# Patient Record
Sex: Female | Born: 1935 | Race: White | Hispanic: No | Marital: Married | Smoking: Never smoker
Health system: Southern US, Community
[De-identification: ages and names within clinical notes are randomized; demographics above are authoritative.]

## PROBLEM LIST (undated history)

## (undated) DIAGNOSIS — K5790 Diverticulosis of intestine, part unspecified, without perforation or abscess without bleeding: Secondary | ICD-10-CM

## (undated) DIAGNOSIS — E039 Hypothyroidism, unspecified: Secondary | ICD-10-CM

## (undated) DIAGNOSIS — M1990 Unspecified osteoarthritis, unspecified site: Secondary | ICD-10-CM

## (undated) DIAGNOSIS — K5732 Diverticulitis of large intestine without perforation or abscess without bleeding: Secondary | ICD-10-CM

## (undated) DIAGNOSIS — K529 Noninfective gastroenteritis and colitis, unspecified: Secondary | ICD-10-CM

## (undated) DIAGNOSIS — I10 Essential (primary) hypertension: Secondary | ICD-10-CM

## (undated) DIAGNOSIS — N3020 Other chronic cystitis without hematuria: Secondary | ICD-10-CM

## (undated) DIAGNOSIS — R0789 Other chest pain: Secondary | ICD-10-CM

## (undated) DIAGNOSIS — K589 Irritable bowel syndrome without diarrhea: Secondary | ICD-10-CM

## (undated) DIAGNOSIS — Z8669 Personal history of other diseases of the nervous system and sense organs: Secondary | ICD-10-CM

## (undated) DIAGNOSIS — R011 Cardiac murmur, unspecified: Secondary | ICD-10-CM

## (undated) HISTORY — PX: COLON SURGERY: SHX602

## (undated) HISTORY — PX: CARDIAC CATHETERIZATION: SHX172

## (undated) HISTORY — PX: ABDOMINAL HYSTERECTOMY: SHX81

## (undated) HISTORY — PX: EYE SURGERY: SHX253

---

## 1988-01-03 HISTORY — PX: BLADDER SUSPENSION: SHX72

## 2003-12-23 ENCOUNTER — Ambulatory Visit: Payer: Self-pay | Admitting: Unknown Physician Specialty

## 2003-12-31 ENCOUNTER — Ambulatory Visit: Payer: Self-pay | Admitting: Unknown Physician Specialty

## 2004-05-24 ENCOUNTER — Ambulatory Visit: Payer: Self-pay | Admitting: Urology

## 2005-02-28 ENCOUNTER — Ambulatory Visit: Payer: Self-pay | Admitting: Unknown Physician Specialty

## 2005-05-16 ENCOUNTER — Ambulatory Visit: Payer: Self-pay | Admitting: Unknown Physician Specialty

## 2005-07-19 ENCOUNTER — Ambulatory Visit: Payer: Self-pay | Admitting: Urology

## 2006-03-02 ENCOUNTER — Ambulatory Visit: Payer: Self-pay | Admitting: Unknown Physician Specialty

## 2007-04-02 ENCOUNTER — Ambulatory Visit: Payer: Self-pay | Admitting: Unknown Physician Specialty

## 2008-04-01 ENCOUNTER — Ambulatory Visit: Payer: Self-pay | Admitting: Unknown Physician Specialty

## 2008-05-07 ENCOUNTER — Ambulatory Visit: Payer: Self-pay | Admitting: Unknown Physician Specialty

## 2009-06-03 ENCOUNTER — Ambulatory Visit: Payer: Self-pay | Admitting: Unknown Physician Specialty

## 2009-07-14 ENCOUNTER — Ambulatory Visit: Payer: Self-pay | Admitting: Unknown Physician Specialty

## 2009-10-01 ENCOUNTER — Other Ambulatory Visit: Payer: Self-pay | Admitting: Unknown Physician Specialty

## 2009-10-13 ENCOUNTER — Ambulatory Visit: Payer: Self-pay | Admitting: Unknown Physician Specialty

## 2010-03-03 ENCOUNTER — Ambulatory Visit: Payer: Self-pay | Admitting: Unknown Physician Specialty

## 2010-07-15 ENCOUNTER — Inpatient Hospital Stay: Payer: Self-pay | Admitting: Internal Medicine

## 2010-08-16 ENCOUNTER — Ambulatory Visit: Payer: Self-pay | Admitting: Unknown Physician Specialty

## 2010-11-30 ENCOUNTER — Ambulatory Visit: Payer: Self-pay | Admitting: Surgery

## 2011-08-17 ENCOUNTER — Ambulatory Visit: Payer: Self-pay | Admitting: Physician Assistant

## 2012-02-09 ENCOUNTER — Ambulatory Visit: Payer: Self-pay | Admitting: Surgery

## 2012-02-20 ENCOUNTER — Ambulatory Visit: Payer: Self-pay | Admitting: Surgery

## 2012-02-20 LAB — CBC WITH DIFFERENTIAL/PLATELET
Basophil #: 0.1 10*3/uL (ref 0.0–0.1)
Eosinophil %: 2.1 %
HCT: 41.4 % (ref 35.0–47.0)
HGB: 13.6 g/dL (ref 12.0–16.0)
Lymphocyte #: 1.7 10*3/uL (ref 1.0–3.6)
Lymphocyte %: 29.2 %
MCH: 30.4 pg (ref 26.0–34.0)
MCV: 93 fL (ref 80–100)
Monocyte #: 0.4 x10 3/mm (ref 0.2–0.9)
Neutrophil %: 60.2 %
RBC: 4.46 10*6/uL (ref 3.80–5.20)
WBC: 5.8 10*3/uL (ref 3.6–11.0)

## 2012-02-20 LAB — COMPREHENSIVE METABOLIC PANEL
Albumin: 3.7 g/dL (ref 3.4–5.0)
Bilirubin,Total: 0.5 mg/dL (ref 0.2–1.0)
Chloride: 105 mmol/L (ref 98–107)
Osmolality: 277 (ref 275–301)
Potassium: 3.9 mmol/L (ref 3.5–5.1)
SGOT(AST): 24 U/L (ref 15–37)
SGPT (ALT): 19 U/L (ref 12–78)
Total Protein: 7.8 g/dL (ref 6.4–8.2)

## 2012-02-26 ENCOUNTER — Inpatient Hospital Stay: Payer: Self-pay | Admitting: Surgery

## 2012-02-28 LAB — PLATELET COUNT: Platelet: 249 10*3/uL (ref 150–440)

## 2012-02-28 LAB — PATHOLOGY REPORT

## 2012-10-24 ENCOUNTER — Ambulatory Visit: Payer: Self-pay | Admitting: Physician Assistant

## 2013-06-18 ENCOUNTER — Ambulatory Visit: Payer: Self-pay | Admitting: Physician Assistant

## 2013-11-06 ENCOUNTER — Ambulatory Visit: Payer: Self-pay | Admitting: Unknown Physician Specialty

## 2013-11-12 ENCOUNTER — Ambulatory Visit: Payer: Self-pay | Admitting: Physician Assistant

## 2013-11-19 ENCOUNTER — Ambulatory Visit: Payer: Self-pay | Admitting: Unknown Physician Specialty

## 2014-04-24 NOTE — Op Note (Signed)
PATIENT NAME:  Chelsea Simpson, Chelsea Simpson MR#:  [id number] DATE OF BIRTH:  May 01, 1932  DATE OF PROCEDURE:  02/26/2012  PREOPERATIVE DIAGNOSIS: Chronic intermittent diverticulitis.   POSTOPERATIVE DIAGNOSIS: Chronic intermittent diverticulitis.   PROCEDURE: Laparoscopic low anterior resection of the sigmoid colon.   SURGEON: Rochel Brome, MD  ANESTHESIA: General.   INDICATIONS: This 80 year old female has had multiple bouts of diverticulitis with left lower quadrant abdominal pain and tenderness and treated with antibiotics. She had colonoscopy findings of diverticulosis, barium enema findings of diverticulosis, and CT findings of diverticulitis and surgery was recommended for definitive treatment to help avoid future cases of diverticulitis.  DESCRIPTION OF PROCEDURE: The patient was placed on the operating table, in the supine position, under general endotracheal anesthesia. The circulating nurse inserted a Foley urinary catheter with Betadine preparation of the perineum draining a clear yellow urine. The legs were placed into the lithotomy position using bumblebee stirrups. The abdomen was prepared with ChloraPrep and the perineum was prepared with Betadine solution. The abdomen was draped in a sterile manner.   A short incision was made just below the umbilicus and carried down to the deep fascia which was grasped with laryngeal hook and elevated. A Veress needle was inserted, aspirated and irrigated with a saline solution. Next, the peritoneal cavity was inflated with carbon dioxide. The Veress needle was removed. The 10 mm cannula was inserted. The 30 degree, 10 mm laparoscope was inserted to view the peritoneal cavity. There were a number of adhesions in the left lower quadrant between the omentum and the pelvic sidewall. The patient was placed in Trendelenburg position. Another incision was made, in the right lower quadrant, to introduce a 12 mm cannula. Another incision was made in the left lower  quadrant to introduce an 11 mm cannula. Another incision was made in the suprapubic area to insert an 11 mm cannula. Next, with the patient in Trendelenburg position, the small bowel was maneuvered up out of the pelvis and carried out towards the liver. A number of adhesions between the small bowel and the right pelvic sidewall were taken down with the Harmonic scalpel allowing further mobilization of the small bowel. Next, a number of adhesions between the omentum and the sigmoid colon were taken down from the left pelvic sidewall. The sigmoid colon was examined and noted multiple diverticula in the mid to distal portion of the sigmoid colon. The site for the distal margin of resection was identified and also selected a site for the proximal margin of resection which was approximately the junction of the upper third and middle third of the sigmoid colon. The window was created in the mesentery at the intended upper margin of resection and the Echelon Endo GIA stapler was placed across the bowel using blue load, engaged, activated and separated the bowel. Next, portions of the mesentery were dissected with the Harmonic scalpel and also portions of the mesentery were divided with white load of the Endo GIA stapler. Further dissection was carried out to create a window between the junction of the distal sigmoid colon and the proximal rectum and the blue load EEA stapler was placed across the bowel and activated. Next, the remainder of the mesenteric dissection was carried out with the Harmonic scalpel. The resected portion of bowel was held with a Babcock clamp and the proximal margin was held with another Babcock clamp. The air was allowed to escape from the peritoneal cavity as the laparoscope was removed. Next, a small incision was made in the suprapubic  area by removing the suprapubic trocar and made an incision which was approximately 4 cm in length and carried down through subcutaneous tissues. The midline  fascia was incised and the peritoneal cavity was opened. Next, the specimen was delivered up with a Babcock clamp. The distal end was tagged with a stitch. The specimen was some 10 inches in length. Multiple diverticula were noted and also there was 1 site which appeared that likely had been involved with diverticulitis where there was some localized swelling and induration, and this was submitted in formalin for routine pathology. Next, the proximal end was brought up through the incision. The staple line was excised as the edges were held with Allis clamps. The caliber of the bowel appeared to be somewhat small and attempted to insert the small sizer but would not fit. Bowel was dilated partially with Kelly clamp. Also had the anesthetist give 0.5 mg of atropine and subsequently the 3-0 Prolene pursestring suture was placed. The 25 mm EEA stapler was selected and the anvil was disconnected and placed into the proximal portion of bowel and the pursestring was tied down. A small amount of dissection was carried out to mobilize some fat off the bowel and there was 1 weak area and the pursestring was reinforced with another 3-0 Prolene. Next, the anvil with the distal portion of bowel was returned to the abdominal cavity. The peritoneum was closed with a running 3-0 chromic. The fascia was closed with interrupted 0 Maxon figure-of-eight sutures. Next, the laparoscope was reinserted as the abdomen was insufflated with carbon dioxide. The anvil was identified and I could see that there was no twisting of the bowel. Next, the small sizer was introduced through the rectum and went up to just a short distance from the staple line, but met some resistance due to small caliber, and also used to next size sizer. The EEA was inserted and was brought out along the anterior wall of the junction of the sigmoid colon and rectum as the pin was brought out and was attached to the anvil and the EEA was engaged to the firing range and  then was activated and subsequently removed. The anastomotic rings were intact and anastomosis looked good. A small amount of blood was aspirated during the course of the procedure and at this time it was irrigated with saline solution and aspirated. Hemostasis appeared to be intact. There was no tension on the anastomosis. Next, the laparoscopic ports were removed as carbon dioxide was allowed to escape from the peritoneal cavity. The skin incisions were closed with interrupted 5-0 Monocryl subcuticular suture, benzoin and Steri-Strips. Dressings were applied with paper tape. The patient tolerated surgery satisfactorily. Foley catheter was left intact. The patient was prepared for transfer to the recovery room.  ____________________________ Lenna Sciara. Rochel Brome, MD jws:sb D: 02/26/2012 11:23:59 ET T: 02/26/2012 12:07:39 ET JOB#: [id number]  cc: Loreli Dollar, MD, <Dictator> Loreli Dollar MD ELECTRONICALLY SIGNED 02/29/2012 17:59

## 2014-04-27 LAB — SURGICAL PATHOLOGY

## 2014-08-26 ENCOUNTER — Encounter: Payer: Self-pay | Admitting: *Deleted

## 2014-08-26 DIAGNOSIS — R011 Cardiac murmur, unspecified: Secondary | ICD-10-CM | POA: Diagnosis not present

## 2014-08-26 DIAGNOSIS — I10 Essential (primary) hypertension: Secondary | ICD-10-CM | POA: Diagnosis not present

## 2014-08-26 DIAGNOSIS — H2511 Age-related nuclear cataract, right eye: Secondary | ICD-10-CM | POA: Diagnosis present

## 2014-08-26 DIAGNOSIS — E039 Hypothyroidism, unspecified: Secondary | ICD-10-CM | POA: Diagnosis not present

## 2014-09-01 ENCOUNTER — Ambulatory Visit: Payer: Medicare Other | Admitting: Anesthesiology

## 2014-09-01 ENCOUNTER — Encounter
Admission: RE | Disposition: A | Discharge status: Home / Self Care | Payer: Self-pay | Source: Ambulatory Visit | Attending: Ophthalmology

## 2014-09-01 ENCOUNTER — Encounter: Payer: Self-pay | Admitting: *Deleted

## 2014-09-01 ENCOUNTER — Ambulatory Visit
Admission: RE | Admit: 2014-09-01 | Discharge: 2014-09-01 | Disposition: A | Discharge status: Home / Self Care | Payer: Medicare Other | Source: Ambulatory Visit | Attending: Ophthalmology | Admitting: Ophthalmology

## 2014-09-01 DIAGNOSIS — I10 Essential (primary) hypertension: Secondary | ICD-10-CM | POA: Insufficient documentation

## 2014-09-01 DIAGNOSIS — E039 Hypothyroidism, unspecified: Secondary | ICD-10-CM | POA: Insufficient documentation

## 2014-09-01 DIAGNOSIS — H2511 Age-related nuclear cataract, right eye: Secondary | ICD-10-CM | POA: Insufficient documentation

## 2014-09-01 DIAGNOSIS — R011 Cardiac murmur, unspecified: Secondary | ICD-10-CM | POA: Insufficient documentation

## 2014-09-01 HISTORY — DX: Hypothyroidism, unspecified: E03.9

## 2014-09-01 HISTORY — DX: Cardiac murmur, unspecified: R01.1

## 2014-09-01 HISTORY — DX: Essential (primary) hypertension: I10

## 2014-09-01 HISTORY — PX: CATARACT EXTRACTION W/PHACO: SHX586

## 2014-09-01 SURGERY — PHACOEMULSIFICATION, CATARACT, WITH IOL INSERTION
Anesthesia: Monitor Anesthesia Care | Site: Eye | Laterality: Right | Wound class: Clean

## 2014-09-01 MED ORDER — POVIDONE-IODINE 5 % OP SOLN
1.0000 "application " | OPHTHALMIC | Status: AC | PRN
  Administered 2014-09-01: 1 via OPHTHALMIC

## 2014-09-01 MED ORDER — ARMC OPHTHALMIC DILATING GEL
1.0000 | OPHTHALMIC | Status: DC | PRN
Start: 2014-09-01 — End: 2014-09-01
  Administered 2014-09-01: 1 via OPHTHALMIC

## 2014-09-01 MED ORDER — MIDAZOLAM HCL 2 MG/2ML IJ SOLN
INTRAMUSCULAR | Status: DC | PRN
  Administered 2014-09-01: 1 mg via INTRAVENOUS

## 2014-09-01 MED ORDER — POVIDONE-IODINE 5 % OP SOLN
OPHTHALMIC | Status: AC
  Administered 2014-09-01: 1 via OPHTHALMIC
  Filled 2014-09-01: qty 30

## 2014-09-01 MED ORDER — TETRACAINE HCL 0.5 % OP SOLN
OPHTHALMIC | Status: AC
  Administered 2014-09-01: 1 [drp] via OPHTHALMIC
  Filled 2014-09-01: qty 2

## 2014-09-01 MED ORDER — MOXIFLOXACIN HCL 0.5 % OP SOLN
OPHTHALMIC | Status: DC
Start: 2014-09-01 — End: 2014-09-01
  Filled 2014-09-01: qty 3

## 2014-09-01 MED ORDER — NA CHONDROIT SULF-NA HYALURON 40-17 MG/ML IO SOLN
INTRAOCULAR | Status: DC | PRN
  Administered 2014-09-01: 1 mL via INTRAOCULAR

## 2014-09-01 MED ORDER — NA CHONDROIT SULF-NA HYALURON 40-17 MG/ML IO SOLN
INTRAOCULAR | Status: AC
  Filled 2014-09-01: qty 1

## 2014-09-01 MED ORDER — TETRACAINE HCL 0.5 % OP SOLN
1.0000 [drp] | OPHTHALMIC | Status: AC | PRN
Start: 2014-09-01 — End: 2014-09-01
  Administered 2014-09-01: 1 [drp] via OPHTHALMIC

## 2014-09-01 MED ORDER — MOXIFLOXACIN HCL 0.5 % OP SOLN: 1.0000 [drp] | OPHTHALMIC | Status: DC | PRN

## 2014-09-01 MED ORDER — EPINEPHRINE HCL 1 MG/ML IJ SOLN
INTRAOCULAR | Status: DC | PRN
  Administered 2014-09-01: 200 mL via OPHTHALMIC

## 2014-09-01 MED ORDER — ARMC OPHTHALMIC DILATING GEL
OPHTHALMIC | Status: AC
  Administered 2014-09-01: 1 via OPHTHALMIC
  Filled 2014-09-01: qty 0.25

## 2014-09-01 MED ORDER — SODIUM CHLORIDE 0.9 % IV SOLN
INTRAVENOUS | Status: DC
  Administered 2014-09-01: 08:00:00 via INTRAVENOUS

## 2014-09-01 MED ORDER — MOXIFLOXACIN HCL 0.5 % OP SOLN
OPHTHALMIC | Status: DC | PRN
  Administered 2014-09-01: 1 [drp] via OPHTHALMIC

## 2014-09-01 MED ORDER — CEFUROXIME OPHTHALMIC INJECTION 1 MG/0.1 ML
INJECTION | OPHTHALMIC | Status: AC
  Filled 2014-09-01: qty 0.1

## 2014-09-01 MED ORDER — FENTANYL CITRATE (PF) 100 MCG/2ML IJ SOLN
INTRAMUSCULAR | Status: DC | PRN
  Administered 2014-09-01: 50 ug via INTRAVENOUS

## 2014-09-01 MED ORDER — CEFUROXIME OPHTHALMIC INJECTION 1 MG/0.1 ML
INJECTION | OPHTHALMIC | Status: DC | PRN
  Administered 2014-09-01: 0.1 mL via INTRACAMERAL

## 2014-09-01 MED ORDER — EPINEPHRINE HCL 1 MG/ML IJ SOLN
INTRAMUSCULAR | Status: AC
  Filled 2014-09-01: qty 1

## 2014-09-01 SURGICAL SUPPLY — 22 items
CANNULA ANT/CHMB 27GA (MISCELLANEOUS) ×3 IMPLANT
CUP MEDICINE 2OZ PLAST GRAD ST (MISCELLANEOUS) ×3 IMPLANT
GLOVE BIO SURGEON STRL SZ8 (GLOVE) ×3 IMPLANT
GLOVE BIOGEL M 6.5 STRL (GLOVE) ×3 IMPLANT
GLOVE SURG LX 8.0 MICRO (GLOVE) ×2
GLOVE SURG LX STRL 8.0 MICRO (GLOVE) ×1 IMPLANT
GOWN STRL REUS W/ TWL LRG LVL3 (GOWN DISPOSABLE) ×2 IMPLANT
GOWN STRL REUS W/TWL LRG LVL3 (GOWN DISPOSABLE) ×6
LENS IOL TECNIS 25.0 (Intraocular Lens) ×3 IMPLANT
LENS IOL TECNIS MONO 1P 25.0 (Intraocular Lens) ×1 IMPLANT
PACK CATARACT (MISCELLANEOUS) ×3 IMPLANT
PACK CATARACT BRASINGTON LX (MISCELLANEOUS) ×3 IMPLANT
PACK EYE AFTER SURG (MISCELLANEOUS) ×3 IMPLANT
SOL BSS BAG (MISCELLANEOUS) ×3
SOL PREP PVP 2OZ (MISCELLANEOUS) ×3
SOLUTION BSS BAG (MISCELLANEOUS) ×1 IMPLANT
SOLUTION PREP PVP 2OZ (MISCELLANEOUS) ×1 IMPLANT
SYR 3ML LL SCALE MARK (SYRINGE) ×3 IMPLANT
SYR 5ML LL (SYRINGE) ×3 IMPLANT
SYR TB 1ML 27GX1/2 LL (SYRINGE) ×3 IMPLANT
WATER STERILE IRR 1000ML POUR (IV SOLUTION) ×3 IMPLANT
WIPE NON LINTING 3.25X3.25 (MISCELLANEOUS) ×3 IMPLANT

## 2014-09-01 NOTE — Op Note (Signed)
PREOPERATIVE DIAGNOSIS:  Nuclear sclerotic cataract of the right eye.   POSTOPERATIVE DIAGNOSIS: right nucelar sclerotic cataract   OPERATIVE PROCEDURE:  Procedure(s): CATARACT EXTRACTION PHACO AND INTRAOCULAR LENS PLACEMENT (IOC)   SURGEON:  Galen Manila, MD.   ANESTHESIA:  Anesthesiologist: Yves Dill, MD CRNA: Chong Sicilian, CRNA  1.      Managed anesthesia care. 2.      Topical tetracaine drops followed by 2% Xylocaine jelly applied in the preoperative holding area.   COMPLICATIONS:  None.   TECHNIQUE:   Stop and chop   DESCRIPTION OF PROCEDURE:  The patient was examined and consented in the preoperative holding area where the aforementioned topical anesthesia was applied to the right eye and then brought back to the Operating Room where the right eye was prepped and draped in the usual sterile ophthalmic fashion and a lid speculum was placed. A paracentesis was created with the side port blade and the anterior chamber was filled with viscoelastic. A near clear corneal incision was performed with the steel keratome. A continuous curvilinear capsulorrhexis was performed with a cystotome followed by the capsulorrhexis forceps. Hydrodissection and hydrodelineation were carried out with BSS on a blunt cannula. The lens was removed in a stop and chop  technique and the remaining cortical material was removed with the irrigation-aspiration handpiece. The capsular bag was inflated with viscoelastic and the Technis ZCB00  lens was placed in the capsular bag without complication. The remaining viscoelastic was removed from the eye with the irrigation-aspiration handpiece. The wounds were hydrated. The anterior chamber was flushed with Miostat and the eye was inflated to physiologic pressure. 0.1 mL of cefuroxime concentration 10 mg/mL was placed in the anterior chamber. The wounds were found to be water tight. The eye was dressed with Vigamox. The patient was given protective glasses to wear  throughout the day and a shield with which to sleep tonight. The patient was also given drops with which to begin a drop regimen today and will follow-up with me in one day.  Implant Name Type Inv. Item Serial No. Manufacturer Lot No. LRB No. Used  LENS IMPL INTRAOC ZCB00 25.0 - [id number] Intraocular Lens LENS IMPL INTRAOC ZCB00 25.0 [id number]   Right 1   Procedure(s) with comments: CATARACT EXTRACTION PHACO AND INTRAOCULAR LENS PLACEMENT (IOC) (Right) - US:2:12 AP%: 25.1 CDE:33.29 Lot # [id number]  Electronically signed: Ailey Wessling LOUIS 09/01/2014 8:51 AM

## 2014-09-01 NOTE — Transfer of Care (Signed)
Immediate Anesthesia Transfer of Care Note  Patient: Chelsea Simpson  Procedure(s) Performed: Procedure(s) with comments: CATARACT EXTRACTION PHACO AND INTRAOCULAR LENS PLACEMENT (IOC) (Right) - US:2:12 AP%: 25.1 CDE:33.29 Lot # [id number]  Patient Location: PACU and Short Stay  Anesthesia Type:MAC  Level of Consciousness: awake, alert  and oriented  Airway & Oxygen Therapy: Patient Spontanous Breathing and Patient connected to nasal cannula oxygen  Post-op Assessment: Report given to RN and Post -op Vital signs reviewed and stable  Post vital signs: Reviewed and stable  Last Vitals:  Filed Vitals:   09/01/14 0853  BP: 126/58  Pulse: 82  Temp: 36.4 C  Resp:     Complications: No apparent anesthesia complications

## 2014-09-01 NOTE — Discharge Instructions (Signed)
AMBULATORY SURGERY  DISCHARGE INSTRUCTIONS   1) The drugs that you were given will stay in your system until tomorrow so for the next 24 hours you should not:  A) Drive an automobile B) Make any legal decisions C) Drink any alcoholic beverage   2) You may resume regular meals tomorrow.  Today it is better to start with liquids and gradually work up to solid foods.  You may eat anything you prefer, but it is better to start with liquids, then soup and crackers, and gradually work up to solid foods.   3) Please notify your doctor immediately if you have any unusual bleeding, trouble breathing, redness and pain at the surgery site, drainage, fever, or pain not relieved by medication.    4) Additional Instructions:    Eye Surgery Discharge Instructions  Expect mild scratchy sensation or mild soreness. DO NOT RUB YOUR EYE!  The day of surgery:  Minimal physical activity, but bed rest is not required  No reading, computer work, or close hand work  No bending, lifting, or straining.  May watch TV  For 24 hours:  No driving, legal decisions, or alcoholic beverages  Safety precautions  Eat anything you prefer: It is better to start with liquids, then soup then solid foods.  _____ Eye patch should be worn until postoperative exam tomorrow.  ____ Solar shield eyeglasses should be worn for comfort in the sunlight/patch while sleeping  Resume all regular medications including aspirin or Coumadin if these were discontinued prior to surgery. You may shower, bathe, shave, or wash your hair. Tylenol may be taken for mild discomfort.  Call your doctor if you experience significant pain, nausea, or vomiting, fever > 101 or other signs of infection. [phone] or [phone] Specific instructions:  Follow-up Information    Follow up with PORFILIO,WILLIAM LOUIS, MD In 1 day.   Specialty:  Ophthalmology   Why:  August 31 at 8:50am   Contact information:   [address] [place] [address] [phone]         Please contact your physician with any problems or Same Day Surgery at [phone], Monday through Friday 6 am to 4 pm, or Kevin at [facility] number at [phone].

## 2014-09-01 NOTE — Anesthesia Postprocedure Evaluation (Signed)
  Anesthesia Post-op Note  Patient: Chelsea Simpson  Procedure(s) Performed: Procedure(s) with comments: CATARACT EXTRACTION PHACO AND INTRAOCULAR LENS PLACEMENT (IOC) (Right) - US:2:12 AP%: 25.1 CDE:33.29 Lot # [id number]  Anesthesia type:MAC  Patient location: short stay  Post pain: Pain level controlled  Post assessment: Post-op Vital signs reviewed, Patient's Cardiovascular Status Stable, Respiratory Function Stable, Patent Airway and No signs of Nausea or vomiting  Post vital signs: Reviewed and stable  Last Vitals:  Filed Vitals:   09/01/14 0853  BP: 126/58  Pulse: 82  Temp: 36.4 C  Resp:     Level of consciousness: awake, alert  and patient cooperative  Complications: No apparent anesthesia complications

## 2014-09-01 NOTE — H&P (Signed)
  All labs reviewed. Abnormal studies sent to patients PCP when indicated.  Previous H&P reviewed, patient examined, there are NO CHANGES.  Chelsea Simpson LOUIS8/30/20168:21 AM

## 2014-09-01 NOTE — Anesthesia Preprocedure Evaluation (Addendum)
Anesthesia Evaluation  Patient identified by MRN, date of birth, ID band Patient awake    Reviewed: Allergy & Precautions, NPO status , Patient's Chart, lab work & pertinent test results  Airway Mallampati: III  TM Distance: >3 FB Neck ROM: Full    Dental  (+) Caps   Pulmonary neg pulmonary ROS,    Pulmonary exam normal       Cardiovascular hypertension, Normal cardiovascular exam+ Valvular Problems/Murmurs     Neuro/Psych negative neurological ROS  negative psych ROS   GI/Hepatic negative GI ROS, Neg liver ROS,   Endo/Other  Hypothyroidism   Renal/GU negative Renal ROS  negative genitourinary   Musculoskeletal negative musculoskeletal ROS (+)   Abdominal Normal abdominal exam  (+)   Peds negative pediatric ROS (+)  Hematology negative hematology ROS (+)   Anesthesia Other Findings   Reproductive/Obstetrics                            Anesthesia Physical Anesthesia Plan  ASA: II  Anesthesia Plan: MAC   Post-op Pain Management:    Induction: Intravenous  Airway Management Planned: Nasal Cannula  Additional Equipment:   Intra-op Plan:   Post-operative Plan:   Informed Consent: I have reviewed the patients History and Physical, chart, labs and discussed the procedure including the risks, benefits and alternatives for the proposed anesthesia with the patient or authorized representative who has indicated his/her understanding and acceptance.   Dental advisory given  Plan Discussed with: CRNA and Surgeon  Anesthesia Plan Comments:         Anesthesia Quick Evaluation

## 2014-09-14 ENCOUNTER — Encounter: Payer: Self-pay | Admitting: *Deleted

## 2014-09-15 ENCOUNTER — Ambulatory Visit: Payer: Medicare Other | Admitting: Anesthesiology

## 2014-09-15 ENCOUNTER — Encounter
Admission: RE | Disposition: A | Discharge status: Home / Self Care | Payer: Self-pay | Source: Ambulatory Visit | Attending: Ophthalmology

## 2014-09-15 ENCOUNTER — Encounter: Payer: Self-pay | Admitting: *Deleted

## 2014-09-15 ENCOUNTER — Ambulatory Visit
Admission: RE | Admit: 2014-09-15 | Discharge: 2014-09-15 | Disposition: A | Discharge status: Home / Self Care | Payer: Medicare Other | Source: Ambulatory Visit | Attending: Ophthalmology | Admitting: Ophthalmology

## 2014-09-15 DIAGNOSIS — E039 Hypothyroidism, unspecified: Secondary | ICD-10-CM | POA: Diagnosis not present

## 2014-09-15 DIAGNOSIS — H2512 Age-related nuclear cataract, left eye: Secondary | ICD-10-CM | POA: Diagnosis present

## 2014-09-15 DIAGNOSIS — R011 Cardiac murmur, unspecified: Secondary | ICD-10-CM | POA: Insufficient documentation

## 2014-09-15 DIAGNOSIS — I10 Essential (primary) hypertension: Secondary | ICD-10-CM | POA: Insufficient documentation

## 2014-09-15 HISTORY — PX: CATARACT EXTRACTION W/PHACO: SHX586

## 2014-09-15 SURGERY — PHACOEMULSIFICATION, CATARACT, WITH IOL INSERTION
Anesthesia: Monitor Anesthesia Care | Site: Eye | Laterality: Left | Wound class: Clean

## 2014-09-15 MED ORDER — BSS IO SOLN
INTRAOCULAR | Status: DC | PRN
  Administered 2014-09-15: 200 mL via OPHTHALMIC

## 2014-09-15 MED ORDER — TETRACAINE HCL 0.5 % OP SOLN
OPHTHALMIC | Status: AC
  Administered 2014-09-15: 1 [drp] via OPHTHALMIC
  Filled 2014-09-15: qty 2

## 2014-09-15 MED ORDER — NA CHONDROIT SULF-NA HYALURON 40-17 MG/ML IO SOLN
INTRAOCULAR | Status: DC | PRN
  Administered 2014-09-15: 1 mL via INTRAOCULAR

## 2014-09-15 MED ORDER — CEFUROXIME OPHTHALMIC INJECTION 1 MG/0.1 ML
INJECTION | OPHTHALMIC | Status: DC | PRN
  Administered 2014-09-15: 0.1 mL via INTRACAMERAL

## 2014-09-15 MED ORDER — POVIDONE-IODINE 5 % OP SOLN
OPHTHALMIC | Status: AC
  Administered 2014-09-15: 1 via OPHTHALMIC
  Filled 2014-09-15: qty 30

## 2014-09-15 MED ORDER — ARMC OPHTHALMIC DILATING GEL
1.0000 "application " | OPHTHALMIC | Status: AC | PRN
  Administered 2014-09-15 (×2): 1 via OPHTHALMIC

## 2014-09-15 MED ORDER — EPINEPHRINE HCL 1 MG/ML IJ SOLN
INTRAMUSCULAR | Status: AC
  Filled 2014-09-15: qty 1

## 2014-09-15 MED ORDER — POVIDONE-IODINE 5 % OP SOLN
1.0000 "application " | OPHTHALMIC | Status: AC | PRN
  Administered 2014-09-15: 1 via OPHTHALMIC

## 2014-09-15 MED ORDER — DEXMEDETOMIDINE HCL 200 MCG/2ML IV SOLN
INTRAVENOUS | Status: DC | PRN
  Administered 2014-09-15: 6 ug via INTRAVENOUS

## 2014-09-15 MED ORDER — ARMC OPHTHALMIC DILATING GEL
OPHTHALMIC | Status: AC
  Administered 2014-09-15: 1 via OPHTHALMIC
  Filled 2014-09-15: qty 0.25

## 2014-09-15 MED ORDER — SODIUM CHLORIDE 0.9 % IV SOLN
INTRAVENOUS | Status: DC
  Administered 2014-09-15: 11:00:00 via INTRAVENOUS

## 2014-09-15 MED ORDER — CEFUROXIME OPHTHALMIC INJECTION 1 MG/0.1 ML
INJECTION | OPHTHALMIC | Status: AC
  Filled 2014-09-15: qty 0.1

## 2014-09-15 MED ORDER — MOXIFLOXACIN HCL 0.5 % OP SOLN: 1.0000 [drp] | OPHTHALMIC | Status: DC | PRN

## 2014-09-15 MED ORDER — NA CHONDROIT SULF-NA HYALURON 40-17 MG/ML IO SOLN
INTRAOCULAR | Status: AC
  Filled 2014-09-15: qty 1

## 2014-09-15 MED ORDER — MOXIFLOXACIN HCL 0.5 % OP SOLN
OPHTHALMIC | Status: DC | PRN
  Administered 2014-09-15: 1 [drp] via OPHTHALMIC

## 2014-09-15 MED ORDER — TETRACAINE HCL 0.5 % OP SOLN
1.0000 [drp] | OPHTHALMIC | Status: AC | PRN
  Administered 2014-09-15: 1 [drp] via OPHTHALMIC

## 2014-09-15 MED ORDER — MOXIFLOXACIN HCL 0.5 % OP SOLN
OPHTHALMIC | Status: AC
  Filled 2014-09-15: qty 3

## 2014-09-15 MED ORDER — FENTANYL CITRATE (PF) 100 MCG/2ML IJ SOLN
INTRAMUSCULAR | Status: DC | PRN
  Administered 2014-09-15: 50 ug via INTRAVENOUS

## 2014-09-15 SURGICAL SUPPLY — 22 items
CANNULA ANT/CHMB 27GA (MISCELLANEOUS) ×3 IMPLANT
CUP MEDICINE 2OZ PLAST GRAD ST (MISCELLANEOUS) ×3 IMPLANT
GLOVE BIO SURGEON STRL SZ8 (GLOVE) ×3 IMPLANT
GLOVE BIOGEL M 6.5 STRL (GLOVE) ×3 IMPLANT
GLOVE SURG LX 8.0 MICRO (GLOVE) ×2
GLOVE SURG LX STRL 8.0 MICRO (GLOVE) ×1 IMPLANT
GOWN STRL REUS W/ TWL LRG LVL3 (GOWN DISPOSABLE) ×2 IMPLANT
GOWN STRL REUS W/TWL LRG LVL3 (GOWN DISPOSABLE) ×4
LENS IOL TECNIS 24.5 (Intraocular Lens) ×3 IMPLANT
LENS IOL TECNIS MONO 1P 24.5 (Intraocular Lens) ×1 IMPLANT
PACK CATARACT (MISCELLANEOUS) ×3 IMPLANT
PACK CATARACT BRASINGTON LX (MISCELLANEOUS) ×3 IMPLANT
PACK EYE AFTER SURG (MISCELLANEOUS) ×3 IMPLANT
SOL BSS BAG (MISCELLANEOUS) ×3
SOL PREP PVP 2OZ (MISCELLANEOUS) ×3
SOLUTION BSS BAG (MISCELLANEOUS) ×1 IMPLANT
SOLUTION PREP PVP 2OZ (MISCELLANEOUS) ×1 IMPLANT
SYR 3ML LL SCALE MARK (SYRINGE) ×3 IMPLANT
SYR 5ML LL (SYRINGE) ×3 IMPLANT
SYR TB 1ML 27GX1/2 LL (SYRINGE) ×3 IMPLANT
WATER STERILE IRR 1000ML POUR (IV SOLUTION) ×3 IMPLANT
WIPE NON LINTING 3.25X3.25 (MISCELLANEOUS) ×3 IMPLANT

## 2014-09-15 NOTE — Discharge Instructions (Signed)
FOLLOW DR PORFILIO'S POSTOP EYE DROP DISCHARGE INSTRUCTION SHEET AS REVIEWED  Eye Surgery Discharge Instructions  Expect mild scratchy sensation or mild soreness. DO NOT RUB YOUR EYE!  The day of surgery:  Minimal physical activity, but bed rest is not required  No reading, computer work, or close hand work  No bending, lifting, or straining.  May watch TV  For 24 hours:  No driving, legal decisions, or alcoholic beverages  Safety precautions  Eat anything you prefer: It is better to start with liquids, then soup then solid foods.  _____ Eye patch should be worn until postoperative exam tomorrow.  ____ Solar shield eyeglasses should be worn for comfort in the sunlight/patch while sleeping  Resume all regular medications including aspirin or Coumadin if these were discontinued prior to surgery. You may shower, bathe, shave, or wash your hair. Tylenol may be taken for mild discomfort.  Call your doctor if you experience significant pain, nausea, or vomiting, fever > 101 or other signs of infection. [phone] or [phone] Specific instructions:  Follow-up Information    Follow up with Carlena Bjornstad, MD.   Specialty:  Ophthalmology   Why:  09/16/14 @ 9:10 am   Contact information:   [address] [place] [address] [phone]

## 2014-09-15 NOTE — H&P (Signed)
  All labs reviewed. Abnormal studies sent to patients PCP when indicated.  Previous H&P reviewed, patient examined, there are NO CHANGES.  Chelsea Simpson LOUIS9/13/201612:25 PM

## 2014-09-15 NOTE — Anesthesia Preprocedure Evaluation (Addendum)
Anesthesia Evaluation  Patient identified by MRN, date of birth, ID band Patient awake    Reviewed: Allergy & Precautions, NPO status , Patient's Chart, lab work & pertinent test results  History of Anesthesia Complications Negative for: history of anesthetic complications  Airway Mallampati: III  TM Distance: >3 FB Neck ROM: Full    Dental  (+) Caps   Pulmonary neg pulmonary ROS,    Pulmonary exam normal        Cardiovascular Exercise Tolerance: Good hypertension, On Medications Normal cardiovascular exam+ Valvular Problems/Murmurs      Neuro/Psych negative neurological ROS  negative psych ROS   GI/Hepatic negative GI ROS, Neg liver ROS,   Endo/Other  Hypothyroidism   Renal/GU negative Renal ROS  negative genitourinary   Musculoskeletal negative musculoskeletal ROS (+)   Abdominal Normal abdominal exam  (+)   Peds negative pediatric ROS (+)  Hematology negative hematology ROS (+)   Anesthesia Other Findings Past Medical History:   Hypertension                                                 Heart murmur                                                 Hypothyroidism                                               Reproductive/Obstetrics negative OB ROS                            Anesthesia Physical  Anesthesia Plan  ASA: II  Anesthesia Plan: MAC   Post-op Pain Management:    Induction: Intravenous  Airway Management Planned: Nasal Cannula  Additional Equipment:   Intra-op Plan:   Post-operative Plan:   Informed Consent: I have reviewed the patients History and Physical, chart, labs and discussed the procedure including the risks, benefits and alternatives for the proposed anesthesia with the patient or authorized representative who has indicated his/her understanding and acceptance.   Dental advisory given  Plan Discussed with: CRNA and Surgeon  Anesthesia Plan  Comments:         Anesthesia Quick Evaluation

## 2014-09-15 NOTE — Transfer of Care (Signed)
Immediate Anesthesia Transfer of Care Note  Patient: Chelsea Simpson  Procedure(s) Performed: Procedure(s) with comments: CATARACT EXTRACTION PHACO AND INTRAOCULAR LENS PLACEMENT (IOC) (Left) - [place] 01:02.6 AP%: 18.4% CDE: 11.53 lOT #[id number]  Patient Location: PACU  Anesthesia Type:MAC  Level of Consciousness: awake, alert , oriented and patient cooperative  Airway & Oxygen Therapy: Patient Spontanous Breathing  Post-op Assessment: Report given to RN and Post -op Vital signs reviewed and stable  Post vital signs: Reviewed and stable  Last Vitals:  Filed Vitals:   09/15/14 1259  BP: 135/68  Pulse: 76  Temp: 36.6 C  Resp: 16    Complications: No apparent anesthesia complications

## 2014-09-15 NOTE — Op Note (Signed)
PREOPERATIVE DIAGNOSIS:  Nuclear sclerotic cataract of the left eye.   POSTOPERATIVE DIAGNOSIS:  left nuclear sclerotic cataract   OPERATIVE PROCEDURE:  Procedure(s): CATARACT EXTRACTION PHACO AND INTRAOCULAR LENS PLACEMENT (IOC)   SURGEON:  Galen Manila, MD.   ANESTHESIA:   Anesthesiologist: Lenard Simmer, MD CRNA: Darrol Jump, CRNA  1.      Managed anesthesia care. 2.      Topical tetracaine drops followed by 2% Xylocaine jelly applied in the preoperative holding area.   COMPLICATIONS:  None.   TECHNIQUE:   Stop and chop   DESCRIPTION OF PROCEDURE:  The patient was examined and consented in the preoperative holding area where the aforementioned topical anesthesia was applied to the left eye and then brought back to the Operating Room where the left eye was prepped and draped in the usual sterile ophthalmic fashion and a lid speculum was placed. A paracentesis was created with the side port blade and the anterior chamber was filled with viscoelastic. A near clear corneal incision was performed with the steel keratome. A continuous curvilinear capsulorrhexis was performed with a cystotome followed by the capsulorrhexis forceps. Hydrodissection and hydrodelineation were carried out with BSS on a blunt cannula. The lens was removed in a stop and chop  technique and the remaining cortical material was removed with the irrigation-aspiration handpiece. The capsular bag was inflated with viscoelastic and the Technis ZCB00 lens was placed in the capsular bag without complication. The remaining viscoelastic was removed from the eye with the irrigation-aspiration handpiece. The wounds were hydrated. The anterior chamber was flushed with Miostat and the eye was inflated to physiologic pressure. 0.1 mL of cefuroxime concentration 10 mg/mL was placed in the anterior chamber. The wounds were found to be water tight. The eye was dressed with Vigamox. The patient was given protective glasses to wear  throughout the day and a shield with which to sleep tonight. The patient was also given drops with which to begin a drop regimen today and will follow-up with me in one day.  Implant Name Type Inv. Item Serial No. Manufacturer Lot No. LRB No. Used  LENS IMPL INTRAOC ZCB00 24.5 - [id number] Intraocular Lens LENS IMPL INTRAOC ZCB00 24.5 [id number] AMO   Left 1   Procedure(s) with comments: CATARACT EXTRACTION PHACO AND INTRAOCULAR LENS PLACEMENT (IOC) (Left) - [place] 01:02.6 AP%: 18.4% CDE: 11.53 lOT #[id number]  Electronically signed: Eleno Weimar LOUIS 09/15/2014 12:57 PM

## 2014-09-15 NOTE — Anesthesia Postprocedure Evaluation (Signed)
  Anesthesia Post-op Note  Patient: Chelsea Simpson  Procedure(s) Performed: Procedure(s) with comments: CATARACT EXTRACTION PHACO AND INTRAOCULAR LENS PLACEMENT (IOC) (Left) - [place] 01:02.6 AP%: 18.4% CDE: 11.53 lOT #[id number]  Anesthesia type:MAC  Patient location: PACU  Post pain: Pain level controlled  Post assessment: Post-op Vital signs reviewed, Patient's Cardiovascular Status Stable, Respiratory Function Stable, Patent Airway and No signs of Nausea or vomiting  Post vital signs: Reviewed and stable  Last Vitals:  Filed Vitals:   09/15/14 1259  BP: 135/68  Pulse: 76  Temp: 36.6 C  Resp: 16    Level of consciousness: awake, alert  and patient cooperative  Complications: No apparent anesthesia complications

## 2014-12-07 ENCOUNTER — Other Ambulatory Visit: Payer: Self-pay | Admitting: Physician Assistant

## 2014-12-07 DIAGNOSIS — Z1231 Encounter for screening mammogram for malignant neoplasm of breast: Secondary | ICD-10-CM

## 2014-12-15 ENCOUNTER — Ambulatory Visit
Admission: RE | Admit: 2014-12-15 | Discharge: 2014-12-15 | Disposition: A | Discharge status: Home / Self Care | Payer: Medicare Other | Source: Ambulatory Visit | Attending: Physician Assistant | Admitting: Physician Assistant

## 2014-12-15 ENCOUNTER — Other Ambulatory Visit: Payer: Self-pay | Admitting: Physician Assistant

## 2014-12-15 DIAGNOSIS — Z1231 Encounter for screening mammogram for malignant neoplasm of breast: Secondary | ICD-10-CM

## 2015-11-05 ENCOUNTER — Other Ambulatory Visit: Payer: Self-pay | Admitting: Physician Assistant

## 2015-11-05 DIAGNOSIS — Z1231 Encounter for screening mammogram for malignant neoplasm of breast: Secondary | ICD-10-CM

## 2015-12-16 ENCOUNTER — Ambulatory Visit: Payer: Medicare Other

## 2015-12-17 ENCOUNTER — Ambulatory Visit
Admission: RE | Admit: 2015-12-17 | Discharge: 2015-12-17 | Disposition: A | Discharge status: Home / Self Care | Payer: Medicare Other | Source: Ambulatory Visit | Attending: Physician Assistant | Admitting: Physician Assistant

## 2015-12-17 DIAGNOSIS — Z1231 Encounter for screening mammogram for malignant neoplasm of breast: Secondary | ICD-10-CM | POA: Diagnosis not present

## 2015-12-17 DIAGNOSIS — N6489 Other specified disorders of breast: Secondary | ICD-10-CM | POA: Insufficient documentation

## 2016-11-08 ENCOUNTER — Other Ambulatory Visit: Payer: Self-pay | Admitting: Physician Assistant

## 2016-11-08 DIAGNOSIS — Z1231 Encounter for screening mammogram for malignant neoplasm of breast: Secondary | ICD-10-CM

## 2016-12-18 ENCOUNTER — Ambulatory Visit
Admission: RE | Admit: 2016-12-18 | Discharge: 2016-12-18 | Disposition: A | Discharge status: Home / Self Care | Payer: Medicare Other | Source: Ambulatory Visit | Attending: Physician Assistant | Admitting: Physician Assistant

## 2016-12-18 DIAGNOSIS — Z1231 Encounter for screening mammogram for malignant neoplasm of breast: Secondary | ICD-10-CM | POA: Diagnosis present

## 2017-02-01 ENCOUNTER — Encounter: Payer: Self-pay | Admitting: *Deleted

## 2017-02-02 ENCOUNTER — Ambulatory Visit: Payer: Medicare Other | Admitting: Anesthesiology

## 2017-02-02 ENCOUNTER — Encounter
Admission: RE | Disposition: A | Discharge status: Home / Self Care | Payer: Self-pay | Source: Ambulatory Visit | Attending: Unknown Physician Specialty

## 2017-02-02 ENCOUNTER — Ambulatory Visit
Admission: RE | Admit: 2017-02-02 | Discharge: 2017-02-02 | Disposition: A | Discharge status: Home / Self Care | Payer: Medicare Other | Source: Ambulatory Visit | Attending: Unknown Physician Specialty | Admitting: Unknown Physician Specialty

## 2017-02-02 DIAGNOSIS — K5732 Diverticulitis of large intestine without perforation or abscess without bleeding: Secondary | ICD-10-CM | POA: Diagnosis not present

## 2017-02-02 DIAGNOSIS — I10 Essential (primary) hypertension: Secondary | ICD-10-CM | POA: Diagnosis not present

## 2017-02-02 DIAGNOSIS — K589 Irritable bowel syndrome without diarrhea: Secondary | ICD-10-CM | POA: Insufficient documentation

## 2017-02-02 DIAGNOSIS — E039 Hypothyroidism, unspecified: Secondary | ICD-10-CM | POA: Insufficient documentation

## 2017-02-02 DIAGNOSIS — K222 Esophageal obstruction: Secondary | ICD-10-CM | POA: Diagnosis not present

## 2017-02-02 DIAGNOSIS — Z7982 Long term (current) use of aspirin: Secondary | ICD-10-CM | POA: Insufficient documentation

## 2017-02-02 DIAGNOSIS — Z79899 Other long term (current) drug therapy: Secondary | ICD-10-CM | POA: Insufficient documentation

## 2017-02-02 HISTORY — DX: Other chronic cystitis without hematuria: N30.20

## 2017-02-02 HISTORY — DX: Diverticulitis of large intestine without perforation or abscess without bleeding: K57.32

## 2017-02-02 HISTORY — DX: Other chest pain: R07.89

## 2017-02-02 HISTORY — DX: Diverticulosis of intestine, part unspecified, without perforation or abscess without bleeding: K57.90

## 2017-02-02 HISTORY — DX: Noninfective gastroenteritis and colitis, unspecified: K52.9

## 2017-02-02 HISTORY — DX: Personal history of other diseases of the nervous system and sense organs: Z86.69

## 2017-02-02 HISTORY — DX: Irritable bowel syndrome, unspecified: K58.9

## 2017-02-02 HISTORY — PX: ESOPHAGOGASTRODUODENOSCOPY (EGD) WITH PROPOFOL: SHX5813

## 2017-02-02 HISTORY — DX: Unspecified osteoarthritis, unspecified site: M19.90

## 2017-02-02 SURGERY — ESOPHAGOGASTRODUODENOSCOPY (EGD) WITH PROPOFOL
Anesthesia: General

## 2017-02-02 MED ORDER — LIDOCAINE HCL (CARDIAC) 20 MG/ML IV SOLN
INTRAVENOUS | Status: DC | PRN
  Administered 2017-02-02: 40 mg via INTRAVENOUS

## 2017-02-02 MED ORDER — GLYCOPYRROLATE 0.2 MG/ML IJ SOLN
INTRAMUSCULAR | Status: DC | PRN
  Administered 2017-02-02: 0.2 mg via INTRAVENOUS

## 2017-02-02 MED ORDER — SODIUM CHLORIDE 0.9 % IV SOLN: INTRAVENOUS | Status: DC

## 2017-02-02 MED ORDER — SODIUM CHLORIDE 0.9 % IV SOLN
INTRAVENOUS | Status: DC
  Administered 2017-02-02: 1000 mL via INTRAVENOUS

## 2017-02-02 MED ORDER — PROPOFOL 500 MG/50ML IV EMUL
INTRAVENOUS | Status: DC | PRN
  Administered 2017-02-02: 140 ug/kg/min via INTRAVENOUS

## 2017-02-02 MED ORDER — PROPOFOL 10 MG/ML IV BOLUS
INTRAVENOUS | Status: DC | PRN
  Administered 2017-02-02: 70 mg via INTRAVENOUS

## 2017-02-02 NOTE — Anesthesia Post-op Follow-up Note (Signed)
Anesthesia QCDR form completed.        

## 2017-02-02 NOTE — H&P (Signed)
Primary Care Physician:  [facility] Primary Gastroenterologist:  Dr. Mechele CollinElliott  Pre-Procedure History & Physical: HPI:  Chelsea Simpson is a 84 y.o. female is here for an endoscopy.   Past Medical History:  Diagnosis Date  . Arthritis    degenerative arthritis  . Atypical chest pain    with negative cardiac cath 8/05  . Chronic cystitis   . Colitis   . Diverticulitis of colon   . Diverticulosis   . Heart murmur   . History of Bell's palsy   . Hypertension   . Hypothyroidism   . IBS (irritable bowel syndrome)     Past Surgical History:  Procedure Laterality Date  . ABDOMINAL HYSTERECTOMY    . BLADDER SUSPENSION    . CARDIAC CATHETERIZATION    . CATARACT EXTRACTION W/PHACO Right 09/01/2014   Procedure: CATARACT EXTRACTION PHACO AND INTRAOCULAR LENS PLACEMENT (IOC);  Surgeon: Galen ManilaWilliam Porfilio, MD;  Location: [facility] ORS;  Service: Ophthalmology;  Laterality: Right;  US:2:12 AP%: 25.1 CDE:33.29 Lot # [id number]  . CATARACT EXTRACTION W/PHACO Left 09/15/2014   Procedure: CATARACT EXTRACTION PHACO AND INTRAOCULAR LENS PLACEMENT (IOC);  Surgeon: Galen ManilaWilliam Porfilio, MD;  Location: [facility] ORS;  Service: Ophthalmology;  Laterality: Left;  US 01:02.6 AP%: 18.4% CDE: 11.53 lOT #[id number]  . COLON SURGERY      Prior to Admission medications   Medication Sig Start Date End Date Taking? Authorizing Provider  amLODipine (NORVASC) 5 MG tablet Take 5 mg by mouth daily.   Yes [provider]  aspirin EC 81 MG tablet Take 81 mg by mouth daily.   Yes [provider]  Difluprednate (DUREZOL) 0.05 % EMUL Apply 1 drop to eye daily.   Yes [provider]  levothyroxine (SYNTHROID, LEVOTHROID) 50 MCG tablet Take 50 mcg by mouth daily before breakfast.   Yes [provider]  pravastatin (PRAVACHOL) 40 MG tablet Take 40 mg by mouth daily.   Yes [provider]    Allergies as of 01/17/2017  . (No Known Allergies)    Family History  Problem  Relation Age of Onset  . Breast cancer Mother     Social History   Socioeconomic History  . Marital status: Married    Spouse name: Not on file  . Number of children: Not on file  . Years of education: Not on file  . Highest education level: Not on file  Social Needs  . Financial resource strain: Not on file  . Food insecurity - worry: Not on file  . Food insecurity - inability: Not on file  . Transportation needs - medical: Not on file  . Transportation needs - non-medical: Not on file  Occupational History  . Not on file  Tobacco Use  . Smoking status: Never Smoker  . Smokeless tobacco: Never Used  Substance and Sexual Activity  . Alcohol use: No  . Drug use: No  . Sexual activity: Not on file  Other Topics Concern  . Not on file  Social History Narrative  . Not on file    Review of Systems: See HPI, otherwise negative ROS  Physical Exam: BP (!) 143/69   Pulse 90   Temp (!) 97.5 F (36.4 C) (Tympanic)   Resp 20   Ht 5' (1.524 m)   Wt 64 kg (141 lb)   SpO2 100%   BMI 27.54 kg/m  General:   Alert,  pleasant and cooperative in NAD Head:  Normocephalic and atraumatic. Neck:  Supple; no masses or thyromegaly.  Lungs:  Clear throughout to auscultation.    Heart:  Regular rate and rhythm. Abdomen:  Soft, nontender and nondistended. Normal bowel sounds, without guarding, and without rebound.   Neurologic:  Alert and  oriented x4;  grossly normal neurologically.  Impression/Plan: Chelsea Simpson is here for an endoscopy to be performed for dysphagia.  Risks, benefits, limitations, and alternatives regarding  endoscopy have been reviewed with the patient.  Questions have been answered.  All parties agreeable.   Lynnae Prude, MD  02/02/2017, 8:20 AM

## 2017-02-02 NOTE — Op Note (Signed)
[facility] Gastroenterology Patient Name: Chelsea QuietBetsy Froio Procedure Date: 02/02/2017 8:22 AM MRN: [id number] Account #: [id number] Date of Birth: 06/07/1932 Admit Type: Outpatient Age: [age] [facility] Gender: Female Note Status: Finalized Procedure:            Upper GI endoscopy Indications:          Dysphagia Providers:            Scot Junobert T. Anora Schwenke, MD Referring MD:         Marilynne HalstedMiriam J. Mclaughlin, MD (Referring MD) Medicines:            Propofol per Anesthesia Complications:        No immediate complications. Procedure:            Pre-Anesthesia Assessment:                       - After reviewing the risks and benefits, the patient                        was deemed in satisfactory condition to undergo the                        procedure.                       After obtaining informed consent, the endoscope was                        passed under direct vision. Throughout the procedure,                        the patient's blood pressure, pulse, and oxygen                        saturations were monitored continuously. The Endoscope                        was introduced through the mouth, and advanced to the                        second part of duodenum. The upper GI endoscopy was                        accomplished without difficulty. The patient tolerated                        the procedure well. Findings:      Esophagus showed a ring. This was at the gastro-esophageal junction.       Remainder of the EGD was normal.      A mild Schatzki ring (acquired) was found at the gastroesophageal       junction. A TTS dilator was passed through the scope. Dilatation done to       15mm without any obvious effect. Dilation with a 15-16.5-18 mm balloon       dilator was then performed to 18 mm. Small amount of blood seen after 18       mm balloon used. Fracture of the ring noted. Impression:           - Mild Schatzki ring. Dilated.                       -  No  specimens collected. Recommendation:       - soft food for 3 days, eat slowly, chew well, take                        small bites. Avoid carbonated beverages. Scot Jun, MD 02/02/2017 8:42:19 AM This report has been signed electronically. Number of Addenda: 0 Note Initiated On: 02/02/2017 8:22 AM      [facility]

## 2017-02-02 NOTE — Anesthesia Postprocedure Evaluation (Addendum)
Anesthesia Post Note  Patient: Chelsea Simpson  Procedure(s) Performed: ESOPHAGOGASTRODUODENOSCOPY (EGD) WITH PROPOFOL (N/A )  Patient location during evaluation: Endoscopy Anesthesia Type: General Level of consciousness: awake and alert and oriented Pain management: pain level controlled Vital Signs Assessment: post-procedure vital signs reviewed and stable Respiratory status: spontaneous breathing, nonlabored ventilation and respiratory function stable Cardiovascular status: blood pressure returned to baseline and stable Postop Assessment: no signs of nausea or vomiting Anesthetic complications: no     Last Vitals:  Vitals:   02/02/17 0839 02/02/17 0850  BP: (!) 92/55 (!) 104/57  Pulse: (!) 106 (!) 101  Resp: 10 17  Temp: (!) 36.1 C   SpO2: 100% 99%    Last Pain:  Vitals:   02/02/17 0838  TempSrc: Tympanic                 [name]

## 2017-02-02 NOTE — Transfer of Care (Signed)
Immediate Anesthesia Transfer of Care Note  Patient: Chelsea Simpson  Procedure(s) Performed: ESOPHAGOGASTRODUODENOSCOPY (EGD) WITH PROPOFOL (N/A )  Patient Location: PACU  Anesthesia Type:General  Level of Consciousness: awake, alert  and oriented  Airway & Oxygen Therapy: Patient Spontanous Breathing and Patient connected to nasal cannula oxygen  Post-op Assessment: Report given to RN and Post -op Vital signs reviewed and stable  Post vital signs: Reviewed and stable  Last Vitals:  Vitals:   02/02/17 0740 02/02/17 0839  BP: (!) 143/69 (!) 92/55  Pulse: 90 (!) 106  Resp: 20 10  Temp: (!) 36.4 C (!) 36.1 C  SpO2: 100% 100%    Last Pain:  Vitals:   02/02/17 0740  TempSrc: Tympanic         Complications: No apparent anesthesia complications

## 2017-02-02 NOTE — Anesthesia Preprocedure Evaluation (Signed)
Anesthesia Evaluation  Patient identified by MRN, date of birth, ID band Patient awake    Reviewed: Allergy & Precautions, NPO status , Patient's Chart, lab work & pertinent test results  History of Anesthesia Complications Negative for: history of anesthetic complications  Airway Mallampati: IV  TM Distance: >3 FB Neck ROM: Full  Mouth opening: Limited Mouth Opening  Dental  (+) Implants   Pulmonary neg pulmonary ROS, neg sleep apnea, neg COPD,    breath sounds clear to auscultation- rhonchi (-) wheezing      Cardiovascular hypertension, Pt. on medications (-) CAD, (-) Past MI, (-) Cardiac Stents and (-) CABG  Rhythm:Regular Rate:Normal - Systolic murmurs and - Diastolic murmurs    Neuro/Psych negative neurological ROS  negative psych ROS   GI/Hepatic negative GI ROS, Neg liver ROS,   Endo/Other  neg diabetesHypothyroidism   Renal/GU negative Renal ROS     Musculoskeletal  (+) Arthritis ,   Abdominal (+) - obese,   Peds  Hematology negative hematology ROS (+)   Anesthesia Other Findings Past Medical History: No date: Arthritis     Comment:  degenerative arthritis No date: Atypical chest pain     Comment:  with negative cardiac cath 8/05 No date: Chronic cystitis No date: Colitis No date: Diverticulitis of colon No date: Diverticulosis No date: Heart murmur No date: History of Bell's palsy No date: Hypertension No date: Hypothyroidism No date: IBS (irritable bowel syndrome)   Reproductive/Obstetrics                             Anesthesia Physical Anesthesia Plan  ASA: II  Anesthesia Plan: General   Post-op Pain Management:    Induction: Intravenous  PONV Risk Score and Plan: 2 and Propofol infusion  Airway Management Planned: Natural Airway  Additional Equipment:   Intra-op Plan:   Post-operative Plan:   Informed Consent: I have reviewed the patients History and  Physical, chart, labs and discussed the procedure including the risks, benefits and alternatives for the proposed anesthesia with the patient or authorized representative who has indicated his/her understanding and acceptance.   Dental advisory given  Plan Discussed with: CRNA and Anesthesiologist  Anesthesia Plan Comments:         Anesthesia Quick Evaluation

## 2017-02-05 ENCOUNTER — Encounter: Payer: Self-pay | Admitting: Unknown Physician Specialty

## 2017-11-08 ENCOUNTER — Other Ambulatory Visit: Payer: Self-pay | Admitting: Physician Assistant

## 2017-11-08 DIAGNOSIS — Z1231 Encounter for screening mammogram for malignant neoplasm of breast: Secondary | ICD-10-CM

## 2017-12-20 ENCOUNTER — Ambulatory Visit
Admission: RE | Admit: 2017-12-20 | Discharge: 2017-12-20 | Disposition: A | Discharge status: Home / Self Care | Payer: Medicare Other | Source: Ambulatory Visit | Attending: Physician Assistant | Admitting: Physician Assistant

## 2017-12-20 DIAGNOSIS — Z1231 Encounter for screening mammogram for malignant neoplasm of breast: Secondary | ICD-10-CM | POA: Insufficient documentation

## 2018-01-11 ENCOUNTER — Other Ambulatory Visit: Payer: Self-pay

## 2018-01-11 ENCOUNTER — Ambulatory Visit: Payer: Medicare Other | Admitting: Anesthesiology

## 2018-01-11 ENCOUNTER — Observation Stay
Admission: RE | Admit: 2018-01-11 | Discharge: 2018-01-12 | Disposition: A | Discharge status: Home / Self Care | Payer: Medicare Other | Source: Ambulatory Visit | Attending: Surgery | Admitting: Surgery

## 2018-01-11 ENCOUNTER — Encounter
Admission: RE | Disposition: A | Discharge status: Home / Self Care | Payer: Self-pay | Source: Ambulatory Visit | Attending: Surgery

## 2018-01-11 ENCOUNTER — Encounter: Payer: Self-pay | Admitting: *Deleted

## 2018-01-11 DIAGNOSIS — Z79890 Hormone replacement therapy (postmenopausal): Secondary | ICD-10-CM | POA: Insufficient documentation

## 2018-01-11 DIAGNOSIS — E785 Hyperlipidemia, unspecified: Secondary | ICD-10-CM | POA: Diagnosis not present

## 2018-01-11 DIAGNOSIS — I10 Essential (primary) hypertension: Secondary | ICD-10-CM | POA: Diagnosis not present

## 2018-01-11 DIAGNOSIS — K389 Disease of appendix, unspecified: Secondary | ICD-10-CM

## 2018-01-11 DIAGNOSIS — K3533 Acute appendicitis with perforation and localized peritonitis, with abscess: Secondary | ICD-10-CM | POA: Diagnosis not present

## 2018-01-11 DIAGNOSIS — K4130 Unilateral femoral hernia, with obstruction, without gangrene, not specified as recurrent: Principal | ICD-10-CM | POA: Insufficient documentation

## 2018-01-11 DIAGNOSIS — Z79899 Other long term (current) drug therapy: Secondary | ICD-10-CM | POA: Diagnosis not present

## 2018-01-11 DIAGNOSIS — Z7982 Long term (current) use of aspirin: Secondary | ICD-10-CM | POA: Diagnosis not present

## 2018-01-11 DIAGNOSIS — Z9049 Acquired absence of other specified parts of digestive tract: Secondary | ICD-10-CM | POA: Diagnosis not present

## 2018-01-11 DIAGNOSIS — K4140 Unilateral femoral hernia, with gangrene, not specified as recurrent: Secondary | ICD-10-CM

## 2018-01-11 DIAGNOSIS — E039 Hypothyroidism, unspecified: Secondary | ICD-10-CM | POA: Insufficient documentation

## 2018-01-11 DIAGNOSIS — K4030 Unilateral inguinal hernia, with obstruction, without gangrene, not specified as recurrent: Secondary | ICD-10-CM | POA: Diagnosis present

## 2018-01-11 HISTORY — PX: APPENDECTOMY: SHX54

## 2018-01-11 HISTORY — PX: INGUINAL HERNIA REPAIR: SHX194

## 2018-01-11 LAB — CBC
HCT: 38.2 % (ref 36.0–46.0)
Hemoglobin: 12.1 g/dL (ref 12.0–15.0)
MCH: 29.7 pg (ref 26.0–34.0)
MCHC: 31.7 g/dL (ref 30.0–36.0)
MCV: 93.6 fL (ref 80.0–100.0)
Platelets: 389 10*3/uL (ref 150–400)
RBC: 4.08 MIL/uL (ref 3.87–5.11)
RDW: 12.9 % (ref 11.5–15.5)
WBC: 16.1 10*3/uL — ABNORMAL HIGH (ref 4.0–10.5)
nRBC: 0 % (ref 0.0–0.2)

## 2018-01-11 LAB — CREATININE, SERUM
Creatinine, Ser: 0.93 mg/dL (ref 0.44–1.00)
GFR calc Af Amer: >60 mL/min (ref >60)
GFR calc non Af Amer: 57 mL/min — ABNORMAL LOW (ref >60)

## 2018-01-11 SURGERY — REPAIR, HERNIA, INGUINAL, ADULT
Anesthesia: General | Laterality: Right

## 2018-01-11 MED ORDER — PROPOFOL 10 MG/ML IV BOLUS
INTRAVENOUS | Status: DC | PRN
  Administered 2018-01-11: 120 mg via INTRAVENOUS

## 2018-01-11 MED ORDER — BUPIVACAINE-EPINEPHRINE (PF) 0.5% -1:200000 IJ SOLN
INTRAMUSCULAR | Status: AC
  Filled 2018-01-11: qty 90

## 2018-01-11 MED ORDER — PHENYLEPHRINE HCL 10 MG/ML IJ SOLN
INTRAMUSCULAR | Status: DC | PRN
  Administered 2018-01-11 (×10): 100 ug via INTRAVENOUS

## 2018-01-11 MED ORDER — AMLODIPINE BESYLATE 5 MG PO TABS
5.0000 mg | ORAL_TABLET | Freq: Every day | ORAL | Status: DC
  Administered 2018-01-11: 5 mg via ORAL
  Filled 2018-01-11: qty 1

## 2018-01-11 MED ORDER — ACETAMINOPHEN 325 MG PO TABS
650.0000 mg | ORAL_TABLET | Freq: Four times a day (QID) | ORAL | Status: DC | PRN
  Administered 2018-01-11: 650 mg via ORAL
  Filled 2018-01-11: qty 2

## 2018-01-11 MED ORDER — LACTATED RINGERS IV SOLN
INTRAVENOUS | Status: DC
  Administered 2018-01-11: 12:00:00 via INTRAVENOUS

## 2018-01-11 MED ORDER — SUGAMMADEX SODIUM 200 MG/2ML IV SOLN
INTRAVENOUS | Status: DC | PRN
  Administered 2018-01-11: 150 mg via INTRAVENOUS

## 2018-01-11 MED ORDER — TRAMADOL HCL 50 MG PO TABS
50.0000 mg | ORAL_TABLET | Freq: Four times a day (QID) | ORAL | Status: DC | PRN
  Administered 2018-01-11: 50 mg via ORAL
  Filled 2018-01-11: qty 1

## 2018-01-11 MED ORDER — IBUPROFEN 600 MG PO TABS
ORAL_TABLET | ORAL | Status: AC
  Administered 2018-01-11: 600 mg via ORAL
  Filled 2018-01-11: qty 1

## 2018-01-11 MED ORDER — ACETAMINOPHEN 10 MG/ML IV SOLN
INTRAVENOUS | Status: DC | PRN
  Administered 2018-01-11: 1000 mg via INTRAVENOUS

## 2018-01-11 MED ORDER — FENTANYL CITRATE (PF) 100 MCG/2ML IJ SOLN
INTRAMUSCULAR | Status: DC | PRN
  Administered 2018-01-11: 50 ug via INTRAVENOUS
  Administered 2018-01-11: 25 ug via INTRAVENOUS
  Administered 2018-01-11: 50 ug via INTRAVENOUS
  Administered 2018-01-11: 25 ug via INTRAVENOUS
  Administered 2018-01-11: 50 ug via INTRAVENOUS

## 2018-01-11 MED ORDER — LIDOCAINE HCL (CARDIAC) PF 100 MG/5ML IV SOSY
PREFILLED_SYRINGE | INTRAVENOUS | Status: DC | PRN
  Administered 2018-01-11: 50 mg via INTRAVENOUS

## 2018-01-11 MED ORDER — PRAVASTATIN SODIUM 20 MG PO TABS
40.0000 mg | ORAL_TABLET | Freq: Every day | ORAL | Status: DC
  Administered 2018-01-11: 40 mg via ORAL
  Filled 2018-01-11: qty 2

## 2018-01-11 MED ORDER — ENOXAPARIN SODIUM 40 MG/0.4ML ~~LOC~~ SOLN
40.0000 mg | SUBCUTANEOUS | Status: DC
  Administered 2018-01-12: 40 mg via SUBCUTANEOUS
  Filled 2018-01-11: qty 0.4

## 2018-01-11 MED ORDER — FAMOTIDINE 20 MG PO TABS
ORAL_TABLET | ORAL | Status: AC
  Administered 2018-01-11: 20 mg via ORAL
  Filled 2018-01-11: qty 1

## 2018-01-11 MED ORDER — BUPIVACAINE-EPINEPHRINE (PF) 0.5% -1:200000 IJ SOLN
INTRAMUSCULAR | Status: DC | PRN
  Administered 2018-01-11: 5 mL via PERINEURAL

## 2018-01-11 MED ORDER — HYDROMORPHONE HCL 1 MG/ML IJ SOLN: 0.2000 mg | INTRAMUSCULAR | Status: DC | PRN

## 2018-01-11 MED ORDER — CEFAZOLIN SODIUM-DEXTROSE 2-4 GM/100ML-% IV SOLN
INTRAVENOUS | Status: AC
  Filled 2018-01-11: qty 100

## 2018-01-11 MED ORDER — PROPOFOL 10 MG/ML IV BOLUS
INTRAVENOUS | Status: AC
  Filled 2018-01-11: qty 20

## 2018-01-11 MED ORDER — VITAMIN D 25 MCG (1000 UNIT) PO TABS
1000.0000 [IU] | ORAL_TABLET | Freq: Every day | ORAL | Status: DC
  Administered 2018-01-11: 1000 [IU] via ORAL
  Filled 2018-01-11: qty 1

## 2018-01-11 MED ORDER — ACETAMINOPHEN 650 MG RE SUPP: 650.0000 mg | Freq: Four times a day (QID) | RECTAL | Status: DC | PRN

## 2018-01-11 MED ORDER — CEFAZOLIN (ANCEF) 1 G IV SOLR
2.0000 g | INTRAVENOUS | Status: AC
  Administered 2018-01-11: 2 g

## 2018-01-11 MED ORDER — FENTANYL CITRATE (PF) 100 MCG/2ML IJ SOLN
INTRAMUSCULAR | Status: AC
  Filled 2018-01-11: qty 2

## 2018-01-11 MED ORDER — LEVOTHYROXINE SODIUM 50 MCG PO TABS
50.0000 ug | ORAL_TABLET | Freq: Every day | ORAL | Status: DC
  Administered 2018-01-12: 50 ug via ORAL
  Filled 2018-01-11: qty 1

## 2018-01-11 MED ORDER — ROCURONIUM BROMIDE 100 MG/10ML IV SOLN
INTRAVENOUS | Status: DC | PRN
  Administered 2018-01-11: 10 mg via INTRAVENOUS
  Administered 2018-01-11: 40 mg via INTRAVENOUS

## 2018-01-11 MED ORDER — SODIUM CHLORIDE 0.9 % IV SOLN
INTRAVENOUS | Status: DC | PRN
  Administered 2018-01-11: 50 ug/min via INTRAVENOUS

## 2018-01-11 MED ORDER — IBUPROFEN 600 MG PO TABS
600.0000 mg | ORAL_TABLET | Freq: Four times a day (QID) | ORAL | Status: DC | PRN
  Administered 2018-01-11: 600 mg via ORAL
  Filled 2018-01-11: qty 1

## 2018-01-11 MED ORDER — ONDANSETRON HCL 4 MG/2ML IJ SOLN
INTRAMUSCULAR | Status: DC | PRN
  Administered 2018-01-11: 4 mg via INTRAVENOUS

## 2018-01-11 MED ORDER — DIFLUPREDNATE 0.05 % OP EMUL: 1.0000 [drp] | Freq: Every day | OPHTHALMIC | Status: DC

## 2018-01-11 MED ORDER — SUCCINYLCHOLINE CHLORIDE 20 MG/ML IJ SOLN
INTRAMUSCULAR | Status: DC | PRN
  Administered 2018-01-11: 100 mg via INTRAVENOUS

## 2018-01-11 MED ORDER — FAMOTIDINE 20 MG PO TABS
20.0000 mg | ORAL_TABLET | Freq: Once | ORAL | Status: AC
  Administered 2018-01-11: 20 mg via ORAL

## 2018-01-11 MED ORDER — ACETAMINOPHEN 10 MG/ML IV SOLN
INTRAVENOUS | Status: AC
  Filled 2018-01-11: qty 100

## 2018-01-11 MED ORDER — BUPIVACAINE LIPOSOME 1.3 % IJ SUSP
INTRAMUSCULAR | Status: DC | PRN
  Administered 2018-01-11: 20 mL

## 2018-01-11 MED ORDER — HYDROCODONE-ACETAMINOPHEN 5-325 MG PO TABS: 1.0000 | ORAL_TABLET | ORAL | Status: DC | PRN

## 2018-01-11 SURGICAL SUPPLY — 50 items
ADH SKN CLS APL DERMABOND .7 (GAUZE/BANDAGES/DRESSINGS) ×1
BLADE CLIPPER SURG (BLADE) ×3 IMPLANT
BLADE SURG 15 STRL LF DISP TIS (BLADE) ×1 IMPLANT
BLADE SURG 15 STRL SS (BLADE) ×2
CANISTER SUCT 1200ML W/VALVE (MISCELLANEOUS) ×3 IMPLANT
CHLORAPREP W/TINT 26ML (MISCELLANEOUS) ×3 IMPLANT
COVER WAND RF STERILE (DRAPES) ×3 IMPLANT
DERMABOND ADVANCED (GAUZE/BANDAGES/DRESSINGS) ×2
DERMABOND ADVANCED .7 DNX12 (GAUZE/BANDAGES/DRESSINGS) ×1 IMPLANT
DRAIN PENROSE 1/4X12 LTX (DRAIN) ×3 IMPLANT
DRAPE LAPAROTOMY 100X77 ABD (DRAPES) ×3 IMPLANT
ELECT REM PT RETURN 9FT ADLT (ELECTROSURGICAL) ×3
ELECTRODE REM PT RTRN 9FT ADLT (ELECTROSURGICAL) ×1 IMPLANT
GLOVE BIOGEL PI IND STRL 7.0 (GLOVE) ×1 IMPLANT
GLOVE BIOGEL PI INDICATOR 7.0 (GLOVE) ×2
GLOVE SURG SYN 7.0 (GLOVE) ×6 IMPLANT
GLOVE SURG SYN 7.0 PF PI (GLOVE) ×2 IMPLANT
GOWN STRL REUS W/ TWL LRG LVL3 (GOWN DISPOSABLE) ×2 IMPLANT
GOWN STRL REUS W/TWL LRG LVL3 (GOWN DISPOSABLE) ×4
HANDLE YANKAUER SUCT BULB TIP (MISCELLANEOUS) ×2 IMPLANT
LABEL OR SOLS (LABEL) ×3 IMPLANT
MESH HERNIA 3X4 RECT PHASIX (Mesh General) IMPLANT
MESH HERNIA 7X10 RECT PHASIX (Mesh General) ×2 IMPLANT
NEEDLE HYPO 22GX1.5 SAFETY (NEEDLE) ×6 IMPLANT
NS IRRIG 500ML POUR BTL (IV SOLUTION) ×3 IMPLANT
PACK BASIN MINOR ARMC (MISCELLANEOUS) ×3 IMPLANT
PACK COLON CLEAN CLOSURE (MISCELLANEOUS) ×2 IMPLANT
RELOAD LINEAR CUT PROX 55 BLUE (ENDOMECHANICALS) ×3 IMPLANT
RELOAD PROXIMATE 55MM GREEN (ENDOMECHANICALS) ×3 IMPLANT
RELOAD STAPLE 55 3.8 BLU REG (ENDOMECHANICALS) IMPLANT
RELOAD STAPLE 55 4.5 GRN THCK (ENDOMECHANICALS) IMPLANT
SPONGE LAP 18X18 RF (DISPOSABLE) ×6 IMPLANT
STAPLER PROXIMATE 55 BLUE (STAPLE) ×2 IMPLANT
STAPLER SKIN PROX 35W (STAPLE) ×2 IMPLANT
SUT ETHIBOND NAB MO 7 #0 18IN (SUTURE) ×3 IMPLANT
SUT MNCRL 4-0 (SUTURE) ×2
SUT MNCRL 4-0 27XMFL (SUTURE) ×1
SUT PROLENE 5 0 RB 1 DA (SUTURE) ×2 IMPLANT
SUT SILK 3 0 12 30 (SUTURE) IMPLANT
SUT SILK 3 0 SH 30 (SUTURE) IMPLANT
SUT SILK 3-0 (SUTURE) ×2 IMPLANT
SUT VIC AB 2-0 CT2 27 (SUTURE) ×3 IMPLANT
SUT VIC AB 3-0 SH 27 (SUTURE) ×2
SUT VIC AB 3-0 SH 27X BRD (SUTURE) ×1 IMPLANT
SUTURE MNCRL 4-0 27XMF (SUTURE) ×1 IMPLANT
SYR 10ML LL (SYRINGE) ×3 IMPLANT
SYR 20CC LL (SYRINGE) ×3 IMPLANT
SYR BULB IRRIG 60ML STRL (SYRINGE) ×2 IMPLANT
TOWEL OR 17X26 4PK STRL BLUE (TOWEL DISPOSABLE) ×3 IMPLANT
WATER STERILE IRR 1000ML POUR (IV SOLUTION) ×3 IMPLANT

## 2018-01-11 NOTE — Transfer of Care (Signed)
Immediate Anesthesia Transfer of Care Note  Patient: Chelsea Simpson  Procedure(s) Performed: HERNIA REPAIR Femoral ADULT-INCARCERATED (Right ) APPENDECTOMY (Right )  Patient Location: PACU  Anesthesia Type:General  Level of Consciousness: awake  Airway & Oxygen Therapy: Patient Spontanous Breathing and Patient connected to face mask oxygen  Post-op Assessment: Report given to RN and Post -op Vital signs reviewed and stable  Post vital signs: Reviewed  Last Vitals:  Vitals Value Taken Time  BP 103/60 01/11/2018  3:39 PM  Temp    Pulse 76 01/11/2018  3:40 PM  Resp 17 01/11/2018  3:40 PM  SpO2 100 % 01/11/2018  3:40 PM  Vitals shown include unvalidated device data.  Last Pain:  Vitals:   01/11/18 1130  TempSrc: Temporal  PainSc: 0-No pain         Complications: No apparent anesthesia complications

## 2018-01-11 NOTE — Anesthesia Preprocedure Evaluation (Addendum)
Anesthesia Evaluation  Patient identified by MRN, date of birth, ID band Patient awake    Reviewed: Allergy & Precautions, H&P , NPO status , Patient's Chart, lab work & pertinent test results  Airway Mallampati: III       Dental  (+) Teeth Intact   Pulmonary neg pulmonary ROS,           Cardiovascular hypertension, + Valvular Problems/Murmurs (murmur)      Neuro/Psych negative neurological ROS  negative psych ROS   GI/Hepatic negative GI ROS, Neg liver ROS,   Endo/Other  Hypothyroidism   Renal/GU      Musculoskeletal   Abdominal   Peds  Hematology negative hematology ROS (+)   Anesthesia Other Findings   Past Medical History: No date: Arthritis     Comment:  degenerative arthritis No date: Atypical chest pain     Comment:  with negative cardiac cath 8/05 No date: Chronic cystitis No date: Colitis No date: Diverticulitis of colon No date: Diverticulosis No date: Heart murmur No date: History of Bell's palsy No date: Hypertension No date: Hypothyroidism No date: IBS (irritable bowel syndrome)  Past Surgical History: No date: ABDOMINAL HYSTERECTOMY No date: BLADDER SUSPENSION No date: CARDIAC CATHETERIZATION 09/01/2014: CATARACT EXTRACTION W/PHACO; Right     Comment:  Procedure: CATARACT EXTRACTION PHACO AND INTRAOCULAR               LENS PLACEMENT (IOC);  Surgeon: Galen Manila, MD;                Location: [facility] ORS;  Service: Ophthalmology;  Laterality:              Right;  US:2:12 AP%: 25.1 CDE:33.29 Lot # [id number] 09/15/2014: CATARACT EXTRACTION W/PHACO; Left     Comment:  Procedure: CATARACT EXTRACTION PHACO AND INTRAOCULAR               LENS PLACEMENT (IOC);  Surgeon: Galen Manila, MD;                Location: [facility] ORS;  Service: Ophthalmology;  Laterality:              Left;  [place] 01:02.6 AP%: 18.4% CDE: 11.53 lOT #[id number] No date: COLON SURGERY 02/02/2017: ESOPHAGOGASTRODUODENOSCOPY  (EGD) WITH PROPOFOL; N/A     Comment:  Procedure: ESOPHAGOGASTRODUODENOSCOPY (EGD) WITH               PROPOFOL;  Surgeon: Scot Jun, MD;  Location:               [facility] ENDOSCOPY;  Service: Endoscopy;  Laterality: N/A;     Reproductive/Obstetrics negative OB ROS                           Anesthesia Physical Anesthesia Plan  ASA: III and emergent  Anesthesia Plan: General ETT, Cricoid Pressure and Rapid Sequence   Post-op Pain Management:    Induction:   PONV Risk Score and Plan: Ondansetron, Dexamethasone and Treatment may vary due to age or medical condition  Airway Management Planned:   Additional Equipment:   Intra-op Plan:   Post-operative Plan:   Informed Consent: I have reviewed the patients History and Physical, chart, labs and discussed the procedure including the risks, benefits and alternatives for the proposed anesthesia with the patient or authorized representative who has indicated his/her understanding and acceptance.   Dental Advisory Given  Plan Discussed with: Anesthesiologist, CRNA and Surgeon  Anesthesia Plan Comments: (Pt  ate solid food 3.5 hrs ago.  Per surgeon, case is emergent so we will proceed.)      Anesthesia Quick Evaluation

## 2018-01-11 NOTE — Anesthesia Post-op Follow-up Note (Signed)
Anesthesia QCDR form completed.        

## 2018-01-11 NOTE — H&P (Signed)
[] Show old notesCreate Note1 HERNIA2 BREAST3 GB4 hemorrhoids5 LUMP6 postop7 procedure8 banding9 COLON   Notes with a note time of more than 336 hours ago are hidden. My Note 11:58 AM   Edit         Subjective:   CC: Non-recurrent unilateral inguinal hernia with obstruction without gangrene [K40.30]  HPI:  Chelsea Simpson is a 85 y.o. female who was referred by Loletha Carrow,* for evaluation of above. Symptoms were first noted 2 days ago. Pain is sharp and constant, confined to the right groin, without radiation.  Associated with one episode of nausea/emesis, exacerbated by nothing specific  Lump is not reducible. Patient has no symptoms of  chronic constipation.    Past Medical History:  has a past medical history of Atypical chest pain, Chronic cystitis, Colitis, unspecified, Degenerative arthritis, Diverticulitis of colon (without mention of hemorrhage)(562.11), Diverticulosis, Essential hypertension, benign, Fibrocystic breast disease, H/O mammogram (10/25/2012), History of Bell's palsy, History of bone density study (11/07/2012), Hypothyroidism, IBS (irritable bowel syndrome), Other and unspecified hyperlipidemia, Other symptoms involving digestive system(787.99), and Recurrent UTI.  Past Surgical History:       Past Surgical History:  Procedure Laterality Date  . Bladder tack   11-99   Dr. Logan Bores  . Cardiac cath    . CATARACT EXTRACTION Bilateral 08/2014  . COLON SURGERY  2013  . COLONOSCOPY  04/15/1997   Actue Colitis  . COLONOSCOPY  08/04/2002   Int Hemorrhoids  . COLONOSCOPY  04/01/2008   Colitis  . COLONOSCOPY  11/19/2013   Adenomatous Polyps: No repeat due to age per RTE (dw)  . EGD  11/19/2013   H Pylori: CBF 11/2016; Recall Ltr mailed 09/29/2016 (dw)  . EGD  02/02/2017   Dilated: No repeat per RTE  . HYSTERECTOMY  age 70   age 58 secondary to endometriosis.   . Sigmoid colectomy  02/2012   Dr. Katrinka Blazing    Family History:  family history includes Allergies in her mother; Aortic aneurysm in her father; Asthma in her mother; Breast cancer in her mother; COPD in her mother; Diverticulosis in her mother; High blood pressure (Hypertension) in her mother; Urinary tract infection in her mother.  Social History:  reports that she has never smoked. She has never used smokeless tobacco. She reports that she does not drink alcohol or use drugs.  Current Medications: has a current medication list which includes the following prescription(s): acetaminophen, amlodipine, aspirin, cholecalciferol, levothyroxine, and pravastatin.  Allergies:  Allergies as of 01/11/2018 - Reviewed 01/11/2018  Allergen Reaction Noted  . Atorvastatin Itching 06/11/2013  . Nitrofurantoin macrocrystal Hives 02/01/2017  . Trimethoprim Rash 06/11/2013  . Macrodantin [nitrofurantoin macrocrystalline] Hives 06/11/2013  . Other Other (See Comments) 06/11/2013  . Ace inhibitors Cough 06/11/2013  . Simvastatin Muscle Pain and Other (See Comments) 06/11/2013    ROS:  A 15 point review of systems was performed and pertinent positives and negatives noted in HPI   Objective:   BP 123/79   Pulse 107   Temp 36.6 C (97.9 F) (Oral)   Ht 152.4 cm (5')   Wt 61.1 kg (134 lb 11.2 oz)   BMI 26.31 kg/m   Constitutional :  alert, appears stated age, cooperative and no distress  Lymphatics/Throat:  no asymmetry, masses, or scars  Respiratory:  clear to auscultation bilaterally  Cardiovascular:  regular rate and rhythm  Gastrointestinal: soft, non-tender; bowel sounds normal; no masses,  no organomegaly. inguinal hernia noted.  moderate, incarcerated,  overlying skin changes and erythematous  Musculoskeletal: Steady gait and movement  Skin: Cool and moist, visible surgical scars midline  Psychiatric: Normal affect, non-agitated, not confused       LABS:  n/a   RADS: n/a Assessment:       Non-recurrent unilateral inguinal hernia with  obstruction without gangrene [K40.30]  Plan:   1. Non-recurrent unilateral inguinal hernia with obstruction without gangrene [K40.30]   Discussed the risk of surgery including recurrence, which can be up to 50% in the case of incisional or complex hernias, possible use of prosthetic materials (mesh) and the increased risk of mesh infxn if used, bleeding, chronic pain, post-op infxn, post-op SBO or ileus, and possible re-operation to address said risks. The risks of general anesthetic, if used, includes MI, CVA, sudden death or even reaction to anesthetic medications also discussed. Alternatives include continued observation.  Benefits include possible symptom relief, prevention of incarceration, strangulation, enlargement in size over time, and the risk of emergency surgery in the face of strangulation.   Typical post-op recovery time of 3-5 days with 4-6 weeks of activity restrictions were also discussed.  The patient verbalized understanding and all questions were answered to the patient's satisfaction.   2. Due to degree of tenderness, overling erythema, and irreducible nature of the inguina hernia, I recommended urgent repair in the OR to prevent furhter exacerbation of symptoms.  She is agreeable to plan.  Will proceed to OR today.

## 2018-01-11 NOTE — Anesthesia Procedure Notes (Signed)
Procedure Name: Intubation Performed by: Willette Alma, CRNA Pre-anesthesia Checklist: Patient identified, Patient being monitored, Timeout performed, Emergency Drugs available and Suction available Patient Re-evaluated:Patient Re-evaluated prior to induction Oxygen Delivery Method: Circle system utilized Preoxygenation: Pre-oxygenation with 100% oxygen Induction Type: IV induction, Rapid sequence and Cricoid Pressure applied Laryngoscope Size: Mac and 3 Grade View: Grade III Tube type: Oral Tube size: 7.0 mm Number of attempts: 2 Airway Equipment and Method: Stylet Placement Confirmation: ETT inserted through vocal cords under direct vision,  positive ETCO2 and breath sounds checked- equal and bilateral Secured at: 21 cm Tube secured with: Tape Dental Injury: Injury to lip and Teeth and Oropharynx as per pre-operative assessment

## 2018-01-12 DIAGNOSIS — K4130 Unilateral femoral hernia, with obstruction, without gangrene, not specified as recurrent: Secondary | ICD-10-CM | POA: Diagnosis not present

## 2018-01-12 LAB — CBC
HCT: 35.1 % — ABNORMAL LOW (ref 36.0–46.0)
Hemoglobin: 11 g/dL — ABNORMAL LOW (ref 12.0–15.0)
MCH: 30 pg (ref 26.0–34.0)
MCHC: 31.3 g/dL (ref 30.0–36.0)
MCV: 95.6 fL (ref 80.0–100.0)
Platelets: 332 10*3/uL (ref 150–400)
RBC: 3.67 MIL/uL — AB (ref 3.87–5.11)
RDW: 12.9 % (ref 11.5–15.5)
WBC: 12.2 10*3/uL — ABNORMAL HIGH (ref 4.0–10.5)
nRBC: 0 % (ref 0.0–0.2)

## 2018-01-12 LAB — BASIC METABOLIC PANEL
ANION GAP: 7 (ref 5–15)
BUN: 10 mg/dL (ref 8–23)
CO2: 28 mmol/L (ref 22–32)
Calcium: 8.4 mg/dL — ABNORMAL LOW (ref 8.9–10.3)
Chloride: 103 mmol/L (ref 98–111)
Creatinine, Ser: 0.94 mg/dL (ref 0.44–1.00)
GFR calc Af Amer: >60 mL/min (ref >60)
GFR calc non Af Amer: 56 mL/min — ABNORMAL LOW (ref >60)
Glucose, Bld: 104 mg/dL — ABNORMAL HIGH (ref 70–99)
POTASSIUM: 4.2 mmol/L (ref 3.5–5.1)
Sodium: 138 mmol/L (ref 135–145)

## 2018-01-12 NOTE — Discharge Summary (Signed)
  Patient ID: Chelsea Simpson MRN: [id number] DOB/AGE: [birth year] 85 y.o.  Admit date: 01/11/2018 Discharge date: 01/12/2018   Discharge Diagnoses:  Active Problems:   Strangulated femoral hernia   Procedures: Right Femoral hernia Repair [place] and appendectomy  Hospital Course: 85 yo presented with incarcerated right femoral hernia and underwent tissue repair as well as appendectomy ( appendix incarcerated) reinforced w [employer] ST. She was kept overnight and did very well. At the time of DC she was ambulating, minal pain, takin PO. AVSS. PE: NAD, awake . Abd: soft, incision intact, no infection or peritonitis. Ext: warm and well perfused, no edema. Condition at DC was stable.   Disposition: Discharge disposition: 01-Home or Self Care       Discharge Instructions    Call MD for:  difficulty breathing, headache or visual disturbances   Complete by:  As directed    Call MD for:  extreme fatigue   Complete by:  As directed    Call MD for:  hives   Complete by:  As directed    Call MD for:  persistant dizziness or light-headedness   Complete by:  As directed    Call MD for:  persistant nausea and vomiting   Complete by:  As directed    Call MD for:  redness, tenderness, or signs of infection (pain, swelling, redness, odor or green/yellow discharge around incision site)   Complete by:  As directed    Call MD for:  severe uncontrolled pain   Complete by:  As directed    Call MD for:  temperature >100.4   Complete by:  As directed    Diet - low sodium heart healthy   Complete by:  As directed    Discharge instructions   Complete by:  As directed    May shower tomorrow   Increase activity slowly   Complete by:  As directed    Lifting restrictions   Complete by:  As directed    20 lbs x 6 wks   Remove dressing in 24 hours   Complete by:  As directed      Allergies as of 01/12/2018      Reactions   Lipitor [atorvastatin] Itching   Macrodantin [nitrofurantoin Macrocrystal]  Hives   Trimethoprim Rash   Ace Inhibitors Cough   Zocor [simvastatin] Other (See Comments)   Muscle pain      Medication List    TAKE these medications   acetaminophen 325 MG tablet Commonly known as:  TYLENOL Take 650 mg by mouth every 6 (six) hours as needed.   amLODipine 5 MG tablet Commonly known as:  NORVASC Take 5 mg by mouth daily.   aspirin EC 81 MG tablet Take 81 mg by mouth daily.   cholecalciferol 25 MCG (1000 UT) tablet Commonly known as:  VITAMIN D3 Take 1,000 Units by mouth daily.   DUREZOL 0.05 % Emul Generic drug:  Difluprednate Apply 1 drop to eye daily.   levothyroxine 50 MCG tablet Commonly known as:  SYNTHROID, LEVOTHROID Take 50 mcg by mouth daily before breakfast.   pravastatin 40 MG tablet Commonly known as:  PRAVACHOL Take 40 mg by mouth daily.      Follow-up Information    Sakai, Isami, DO Follow up in 10 day(s).   Specialty:  Surgery Contact information: [address] [place] [address] [phone]            Sterling Big, MD FACS

## 2018-01-14 ENCOUNTER — Encounter: Payer: Self-pay | Admitting: Surgery

## 2018-01-14 NOTE — Op Note (Addendum)
Preoperative diagnosis: right strangulated Inguinal Hernia.  Postoperative diagnosis: Right strangulated Femoral Hernia containing ischemic mesoappendix and appendix  Procedure:  Open RIGHT femoral hernia repair with mesh  Anesthesia: General, LMA  Surgeon: Dr. Tonna Boehringer Assistanct: [place]  Wound Classification: Clean  Specimen: none  Complications: None  Estimated Blood Loss: 69mL   Indications:  Patient is a 85 y.o. female developed a incarcerated right femoral hernia on clinical exam. Urgent repair was indicated to avoid further decline in health and symptoms.  Findings: 1. Right strangulated Femoral Hernia containing ischemic mesoappendix and appendix 2. Phasix mesh used for reinforcement over primary repair 3. Adequate hemostasis achieved  Description of procedure: The patient was taken to the operating room. A time-out was completed verifying correct patient, procedure, site, positioning, and implant(s) and/or special equipment prior to beginning this procedure. The right groin was prepped and draped in the usual sterile fashion. An incision was marked in a natural skin crease and planned to end near the pubic tubercle.  The skin crease incision was made with a knife and deepened through Scarpa's and Camper's fascia with electrocautery until incarcerated hernia contents were noted.  Dissection around this incarcerated hernia sac noted that the contents herniating inferior to the visible inguinal ligament, confirming a strangulated femoral hernia.  Continued dissection around the hernia sac was performed meticulously until the hernia sac itself was opened.  Within the sac, there was noted to be a gangrenous and ischemic mesoappendix along a inflamed but nonischemic appendix.  Due to the extent of injury to the mesoappendix decision was made at this time to proceed with an appendectomy.  A window was created at the appendiceal base, where there was fanning of the tinea noted, and a blue  GIA stapler was used to transect it.  Another blue load stapler was then used to transect the mesoappendix.  The appendix was then removed off the operative field pending pathology.  The peritoneal opening was then closed with running 3-0 Vicryl.  Excess hernia sac contents was then suture ligated with 3-0 Vicryl, hemostasis confirmed, prior to reducing the cyst sac remanent back into the abdominal cavity.  Inspection of the entire region at this point noted a very attenuated nonexistent inguinal floor, and Cooper's ligament, making a repair just with mesh unlikely.  Decision was made at this point to: Dr. Everlene Farrier for further assistance.  Due to the lack of adequate tissue in the area decision was made to proceed with a primary repair reinforced with mesh superficial to it.  Relaxing incision was made in the transverse abdominis and conjoined tendon to allow enough relaxation of the transversalis fascia down to the lacunar ligament.  The inguinal floor was then re-created and approximating these 2 edges with 0 Ethibond in an interrupted fashion.  Afterwards the femoral hernia defect was noted to be completely covered with the floor being under minimal tension.  A Phasix ST mesh was then placed upon this primary repair and sutured into place using 0 Ethibond in interrupted fashion along the attenuated Cooper's ligament and the transversalis fascia.  Surrounding area then infused with Exparel prior to closing the overlying external oblique with running 2-0 Vicryl in a running fashion.  Scarpa's fascia was closed with interrupted 3-0 Vicryl.  The deep dermal layer closed with interrupted 3-0 Vicryl.  The skin was closed with staples, covered with honey comb dressing.  The patient tolerated the procedure well and was taken to the postanesthesia care unit in stable condition. Sponge and instrument count correct  at end of procedure.

## 2018-01-14 NOTE — Anesthesia Postprocedure Evaluation (Signed)
Anesthesia Post Note  Patient: Chelsea Simpson  Procedure(s) Performed: HERNIA REPAIR Femoral ADULT-INCARCERATED (Right ) APPENDECTOMY (Right )  Patient location during evaluation: PACU Anesthesia Type: General Level of consciousness: awake and alert Pain management: pain level controlled Vital Signs Assessment: post-procedure vital signs reviewed and stable Respiratory status: spontaneous breathing, nonlabored ventilation and respiratory function stable Cardiovascular status: blood pressure returned to baseline and stable Postop Assessment: no apparent nausea or vomiting Anesthetic complications: no     Last Vitals:  Vitals:   01/12/18 0413 01/12/18 0819  BP: 125/63 122/63  Pulse: 67 77  Resp: 20   Temp: 36.7 C 36.9 C  SpO2: 98% 95%    Last Pain:  Vitals:   01/12/18 0819  TempSrc: Oral  PainSc:                  Jovita Gamma

## 2018-01-15 LAB — SURGICAL PATHOLOGY

## 2018-04-02 ENCOUNTER — Other Ambulatory Visit: Payer: Self-pay | Admitting: Surgery

## 2018-04-02 DIAGNOSIS — B3789 Other sites of candidiasis: Secondary | ICD-10-CM

## 2018-04-02 MED ORDER — NYSTATIN 100000 UNIT/GM EX POWD: Freq: Three times a day (TID) | CUTANEOUS | 0 refills | Status: AC

## 2018-04-02 NOTE — Progress Notes (Unsigned)
Called patient and notified her of outside lab work wnl.  Will prescribe some nystatin powder for possible candida infection of groin and asked her to continue to monitor.  She verbalized understanding.

## 2018-12-02 ENCOUNTER — Other Ambulatory Visit: Payer: Self-pay | Admitting: Physician Assistant

## 2018-12-02 DIAGNOSIS — Z1231 Encounter for screening mammogram for malignant neoplasm of breast: Secondary | ICD-10-CM

## 2018-12-24 ENCOUNTER — Ambulatory Visit
Admission: RE | Admit: 2018-12-24 | Discharge: 2018-12-24 | Disposition: A | Discharge status: Home / Self Care | Payer: Medicare Other | Source: Ambulatory Visit | Attending: Physician Assistant | Admitting: Physician Assistant

## 2018-12-24 DIAGNOSIS — Z1231 Encounter for screening mammogram for malignant neoplasm of breast: Secondary | ICD-10-CM | POA: Insufficient documentation

## 2019-11-20 ENCOUNTER — Other Ambulatory Visit: Payer: Self-pay | Admitting: Physician Assistant

## 2019-11-20 DIAGNOSIS — Z1231 Encounter for screening mammogram for malignant neoplasm of breast: Secondary | ICD-10-CM

## 2019-12-30 ENCOUNTER — Ambulatory Visit
Admission: RE | Admit: 2019-12-30 | Discharge: 2019-12-30 | Disposition: A | Discharge status: Home / Self Care | Payer: Medicare Other | Source: Ambulatory Visit | Attending: Physician Assistant | Admitting: Physician Assistant

## 2019-12-30 ENCOUNTER — Other Ambulatory Visit: Payer: Self-pay

## 2019-12-30 DIAGNOSIS — Z1231 Encounter for screening mammogram for malignant neoplasm of breast: Secondary | ICD-10-CM | POA: Diagnosis not present

## 2020-11-15 ENCOUNTER — Other Ambulatory Visit: Payer: Self-pay | Admitting: Physician Assistant

## 2020-11-15 DIAGNOSIS — Z1231 Encounter for screening mammogram for malignant neoplasm of breast: Secondary | ICD-10-CM

## 2020-12-30 ENCOUNTER — Other Ambulatory Visit: Payer: Self-pay

## 2020-12-30 ENCOUNTER — Ambulatory Visit
Admission: RE | Admit: 2020-12-30 | Discharge: 2020-12-30 | Disposition: A | Discharge status: Home / Self Care | Payer: Medicare Other | Source: Ambulatory Visit | Attending: Physician Assistant | Admitting: Physician Assistant

## 2020-12-30 DIAGNOSIS — Z1231 Encounter for screening mammogram for malignant neoplasm of breast: Secondary | ICD-10-CM | POA: Insufficient documentation

## 2021-07-03 ENCOUNTER — Emergency Department
Admission: EM | Admit: 2021-07-03 | Discharge: 2021-07-03 | Disposition: A | Discharge status: Home / Self Care | Payer: Medicare Other | Attending: Emergency Medicine | Admitting: Emergency Medicine

## 2021-07-03 ENCOUNTER — Emergency Department: Payer: Medicare Other

## 2021-07-03 ENCOUNTER — Encounter: Payer: Self-pay | Admitting: Emergency Medicine

## 2021-07-03 ENCOUNTER — Other Ambulatory Visit: Payer: Self-pay

## 2021-07-03 DIAGNOSIS — I10 Essential (primary) hypertension: Secondary | ICD-10-CM | POA: Insufficient documentation

## 2021-07-03 DIAGNOSIS — S8992XA Unspecified injury of left lower leg, initial encounter: Secondary | ICD-10-CM | POA: Diagnosis present

## 2021-07-03 DIAGNOSIS — E039 Hypothyroidism, unspecified: Secondary | ICD-10-CM | POA: Insufficient documentation

## 2021-07-03 DIAGNOSIS — Z23 Encounter for immunization: Secondary | ICD-10-CM | POA: Diagnosis not present

## 2021-07-03 DIAGNOSIS — S81832A Puncture wound without foreign body, left lower leg, initial encounter: Secondary | ICD-10-CM | POA: Insufficient documentation

## 2021-07-03 DIAGNOSIS — W269XXA Contact with unspecified sharp object(s), initial encounter: Secondary | ICD-10-CM | POA: Diagnosis not present

## 2021-07-03 MED ORDER — LIDOCAINE HCL (PF) 1 % IJ SOLN
5.0000 mL | Freq: Once | INTRAMUSCULAR | Status: AC
  Administered 2021-07-03: 5 mL
  Filled 2021-07-03: qty 5

## 2021-07-03 MED ORDER — TETANUS-DIPHTH-ACELL PERTUSSIS 5-2.5-18.5 LF-MCG/0.5 IM SUSY
0.5000 mL | PREFILLED_SYRINGE | Freq: Once | INTRAMUSCULAR | Status: AC
  Administered 2021-07-03: 0.5 mL via INTRAMUSCULAR
  Filled 2021-07-03: qty 0.5

## 2021-07-03 NOTE — ED Provider Notes (Signed)
[facility] Emergency Department Provider Note     Event Date/Time   First MD Initiated Contact with Patient 07/03/21 1609     (approximate)   History   Laceration   HPI  Chelsea Simpson is a 88 y.o. female with a history of HTN, hypothyroidism, and arthritis, presents to the ED from [facility] for evaluation of a left leg injury.  Patient was outside picking up sticks, when a large stick that she broke in half, apparently punctured her left lower leg.  She presents with bleeding controlled without history of blood thinner use.  She denies any other injury at this time.     Physical Exam   Triage Vital Signs: ED Triage Vitals  Enc Vitals Group     BP 07/03/21 1422 101/65     Pulse Rate 07/03/21 1422 93     Resp 07/03/21 1422 18     Temp 07/03/21 1422 98.4 F (36.9 C)     Temp Source 07/03/21 1422 Oral     SpO2 07/03/21 1422 93 %     Weight 07/03/21 1420 146 lb (66.2 kg)     Height 07/03/21 1420 5' (1.524 m)     Head Circumference --      Peak Flow --      Pain Score 07/03/21 1419 0     Pain Loc --      Pain Edu? --      Excl. in GC? --     Most recent vital signs: Vitals:   07/03/21 1422  BP: 101/65  Pulse: 93  Resp: 18  Temp: 98.4 F (36.9 C)  SpO2: 93%    General Awake, no distress.  CV:  Good peripheral perfusion.  RESP:  Normal effort.  ABD:  No distention.  MSK:  Left lower extremity with a dorsal medial wound to the lower leg.  Minimal active bleeding at this time.  There is approximately 1 cm skin avulsion exposing underlying subcutaneous fat.  Patient with normal active range of motion of the foot and ankle distally.   ED Results / Procedures / Treatments   Labs (all labs ordered are listed, but only abnormal results are displayed) Labs Reviewed - No data to display   EKG   RADIOLOGY  I personally viewed and evaluated these images as part of my medical decision making, as well as reviewing the written report by the  radiologist.  ED Provider Interpretation: no bony injury, free air, or obvious FB}  DG Ankle Complete Left  Result Date: 07/03/2021 CLINICAL DATA:  Laceration with a stick. EXAM: LEFT ANKLE COMPLETE - 3+ VIEW COMPARISON:  None Available. FINDINGS: There is no evidence of fracture or dislocation. Limited assessment for joint effusion given positioning on the lateral view. The ankle mortise preserved. Moderate plantar calcaneal spur. A dressing overlies the anteromedial aspect of the lower leg. No soft tissue gas. No radiopaque foreign body there are vascular calcifications. IMPRESSION: 1. No acute fracture or dislocation. 2. A dressing overlies the anteromedial lower leg. No soft tissue gas or radiopaque foreign body. Continued clinical follow-up, as wood is not always visible on radiograph. 3. Moderate plantar calcaneal spur. 4. Vascular calcifications. Electronically Signed   By: Narda Rutherford M.D.   On: 07/03/2021 17:07     PROCEDURES:  Critical Care performed: No  ..Laceration Repair  Date/Time: 07/03/2021 5:21 PM  Performed by: Lissa Hoard, PA-C Authorized by: Lissa Hoard, PA-C   Consent:  Consent obtained:  Verbal   Consent given by:  Patient   Risks, benefits, and alternatives were discussed: yes     Risks discussed:  Pain, infection and poor wound healing   Alternatives discussed:  Observation Universal protocol:    Procedure explained and questions answered to patient or proxy's satisfaction: yes     Imaging studies available: yes     Site/side marked: yes     Immediately prior to procedure, a time out was called: yes     Patient identity confirmed:  Verbally with patient Anesthesia:    Anesthesia method:  Local infiltration   Local anesthetic:  Lidocaine 1% w/o epi Laceration details:    Location:  Leg   Leg location:  L lower leg   Length (cm):  1.5 (skin avulsion injury)   Depth (mm):  5 Pre-procedure details:    Preparation:  Patient was  prepped and draped in usual sterile fashion Exploration:    Limited defect created (wound extended): no     Hemostasis achieved with:  Direct pressure   Imaging obtained: x-ray     Imaging outcome: foreign body not noted     Wound exploration: wound explored through full range of motion     Contaminated: no   Treatment:    Area cleansed with:  Povidone-iodine   Irrigation solution:  Sterile saline   Irrigation volume:  30   Irrigation method:  Syringe   Debridement:  None   Undermining:  None   Scar revision: no   Post-procedure details:    Dressing:  Non-adherent dressing and bulky dressing   Procedure completion:  Tolerated well, no immediate complications Comments:     No suture repair warranted for open wound Explored under anesthesia without evidence of organic foreign body    MEDICATIONS ORDERED IN ED: Medications  lidocaine (PF) (XYLOCAINE) 1 % injection 5 mL (5 mLs Infiltration Given by Other 07/03/21 1715)  Tdap (BOOSTRIX) injection 0.5 mL (0.5 mLs Intramuscular Given 07/03/21 1715)     IMPRESSION / MDM / ASSESSMENT AND PLAN / ED COURSE  I reviewed the triage vital signs and the nursing notes.                              Differential diagnosis includes, but is not limited to, skin laceration, puncture wound, bony defect, retained foreign body  Patient's presentation is most consistent with acute complicated illness / injury requiring diagnostic workup.  Patient's diagnosis is consistent with left lower extremity puncture wound and skin tear. Patient will be discharged home with instructions and supplies for wound care.  Superficial skin tear without radiologic evidence of organic foreign body, free air, bony injury, based on my review of images.  While not all organic material (wood) is a visible x-ray, explore the wound under anesthesia and did not find a retained wooden foreign body.  Patient's open wound was covered with nonstick sterile dressing.  Patient is given  wound care instructions, supplies, and strict return precautions.  Patient is to follow up with her primary provider as needed or otherwise directed. Patient is given ED precautions to return to the ED for any worsening or new symptoms.   FINAL CLINICAL IMPRESSION(S) / ED DIAGNOSES   Final diagnoses:  Puncture wound of left lower leg with complication, initial encounter     Rx / DC Orders   ED Discharge Orders     None  Note:  This document was prepared using Dragon voice recognition software and may include unintentional dictation errors.    Lissa Hoard, PA-C 07/03/21 Lauretta Chester    Shaune Pollack, MD 07/06/21 1101

## 2021-07-03 NOTE — ED Triage Notes (Signed)
Pt via POV from home. Pt c/o L lower leg laceration, pt states she was picking up sticks and states that one of them cut her on her L. Pt coming from [facility]. Bleeding controlled and laceration wrapped. Denies blood thinners. Pt is A&OX4 and NAD

## 2021-07-03 NOTE — Discharge Instructions (Addendum)
Your x-ray is negative for bony injury or would foreign body.  Keep in mind that all fluid is visible on x-ray.  The wound itself is kept open and covered with sterile dressing.  Return to the ED for signs of infection including redness, warmth, swelling, or streaking.  Follow-up with primary provider if necessary.  Keep the wound clean, dry, and covered.

## 2021-07-03 NOTE — ED Notes (Signed)
Pt to ED for laceration that occurred today, pt states she was trying to break a piece of a stick and the stick went into her Left lower leg. Pt has laceration covered and bleeding controlled.  Pt ambulatory and A&ox4 upon assessment

## 2021-12-01 ENCOUNTER — Other Ambulatory Visit: Payer: Self-pay | Admitting: Physician Assistant

## 2021-12-01 DIAGNOSIS — Z1231 Encounter for screening mammogram for malignant neoplasm of breast: Secondary | ICD-10-CM

## 2022-01-05 ENCOUNTER — Ambulatory Visit
Admission: RE | Admit: 2022-01-05 | Discharge: 2022-01-05 | Disposition: A | Discharge status: Home / Self Care | Payer: Medicare Other | Source: Ambulatory Visit | Attending: Physician Assistant | Admitting: Physician Assistant

## 2022-01-05 DIAGNOSIS — Z1231 Encounter for screening mammogram for malignant neoplasm of breast: Secondary | ICD-10-CM | POA: Diagnosis not present

## 2022-11-22 ENCOUNTER — Other Ambulatory Visit: Payer: Self-pay | Admitting: Physician Assistant

## 2022-11-22 DIAGNOSIS — Z1231 Encounter for screening mammogram for malignant neoplasm of breast: Secondary | ICD-10-CM

## 2023-01-11 ENCOUNTER — Ambulatory Visit
Admission: RE | Admit: 2023-01-11 | Discharge: 2023-01-11 | Disposition: A | Discharge status: Home / Self Care | Payer: Medicare Other | Source: Ambulatory Visit | Attending: Physician Assistant | Admitting: Physician Assistant

## 2023-01-11 DIAGNOSIS — Z1231 Encounter for screening mammogram for malignant neoplasm of breast: Secondary | ICD-10-CM

## 2023-07-10 ENCOUNTER — Other Ambulatory Visit: Payer: Self-pay | Admitting: Physician Assistant

## 2023-07-10 DIAGNOSIS — Z1231 Encounter for screening mammogram for malignant neoplasm of breast: Secondary | ICD-10-CM

## 2023-12-24 IMAGING — MG MM DIGITAL SCREENING BILAT W/ TOMO AND CAD
8 series · 9 of 24 positions shown · non-contrast
Comparison: Previous exam(s).

CLINICAL DATA: Screening.

EXAM:
DIGITAL SCREENING BILATERAL MAMMOGRAM WITH TOMOSYNTHESIS AND CAD
TECHNIQUE: Bilateral screening digital craniocaudal and mediolateral oblique
mammograms were obtained. Bilateral screening digital breast
tomosynthesis was performed. The images were evaluated with
computer-aided detection.

[R MLO synth-2D]
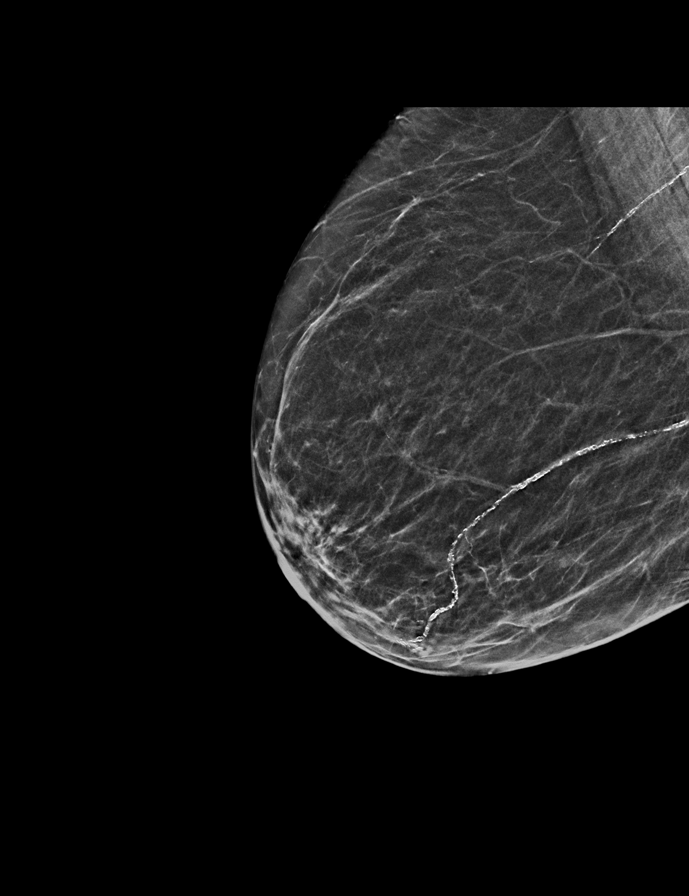

[L CC synth-2D]
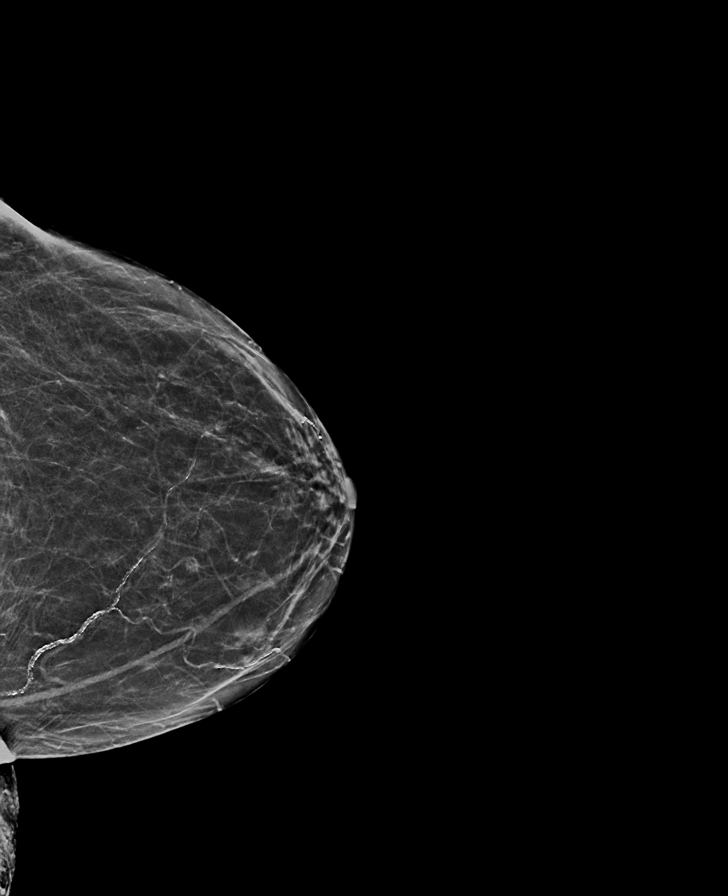

[R CC synth-2D]
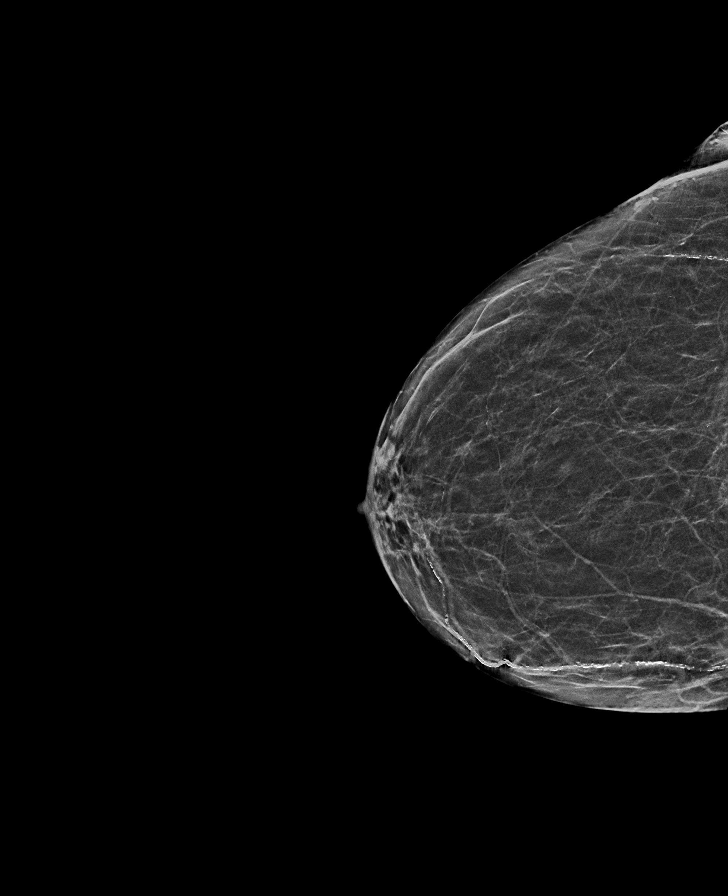

[L MLO synth-2D]
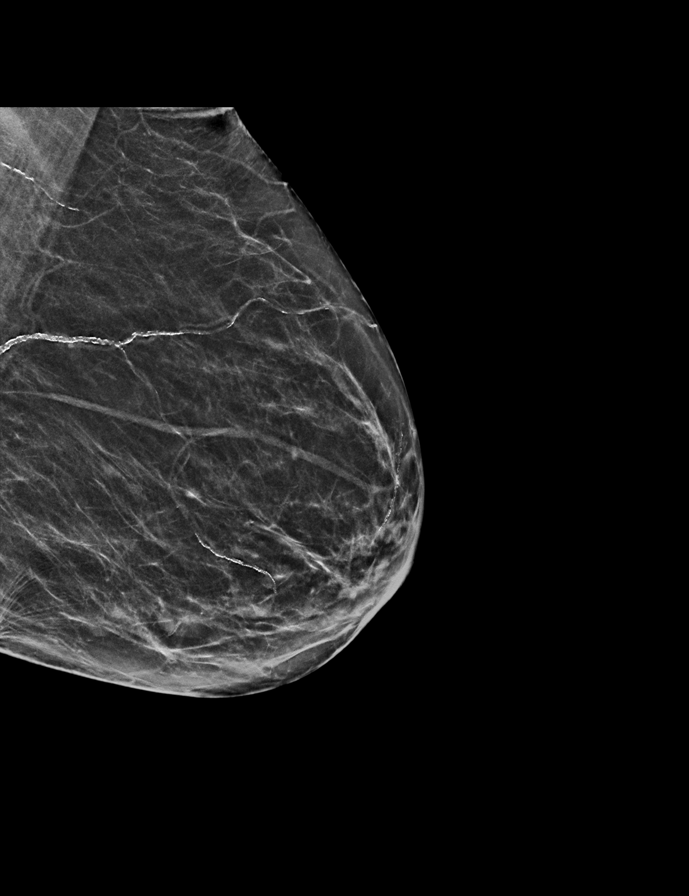

[L CC tomo · 2 of 51 frames shown]
[frame 17/51]
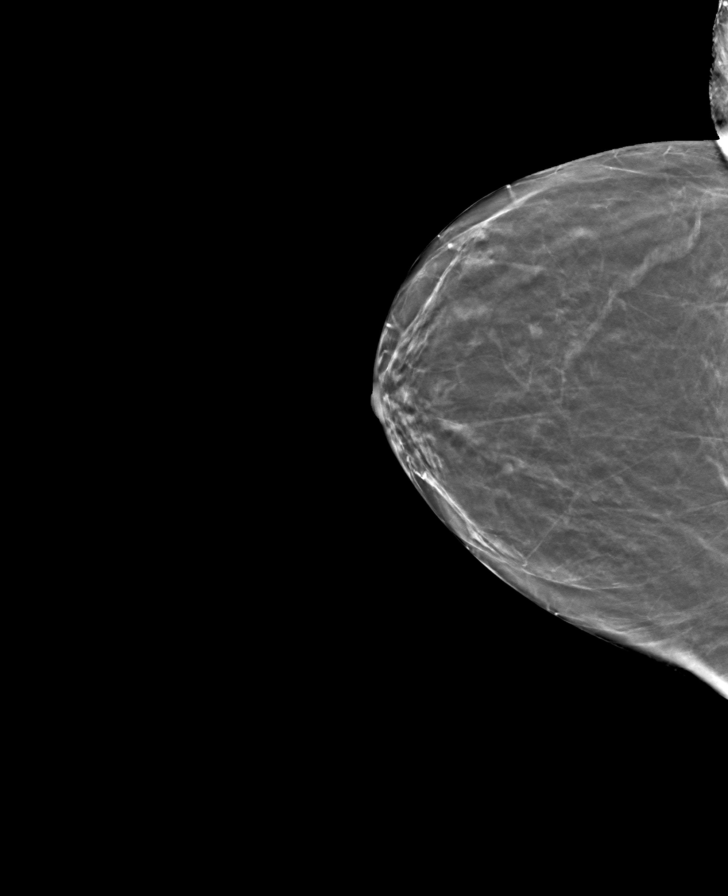
[frame 26/51]
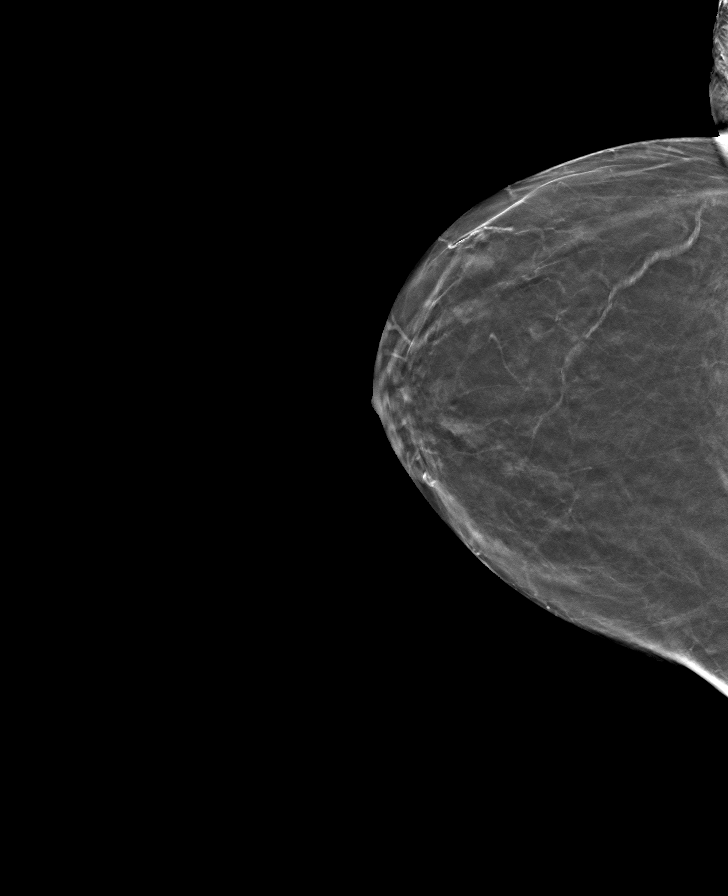

[L MLO tomo · tomo slice 27/52.0]
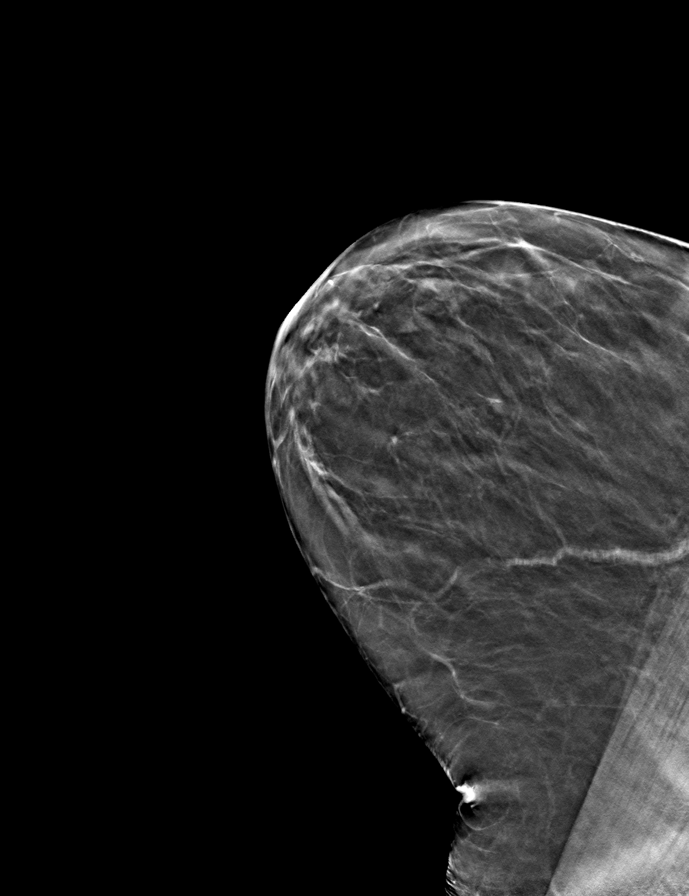

[R CC tomo · tomo slice 24/47.0]
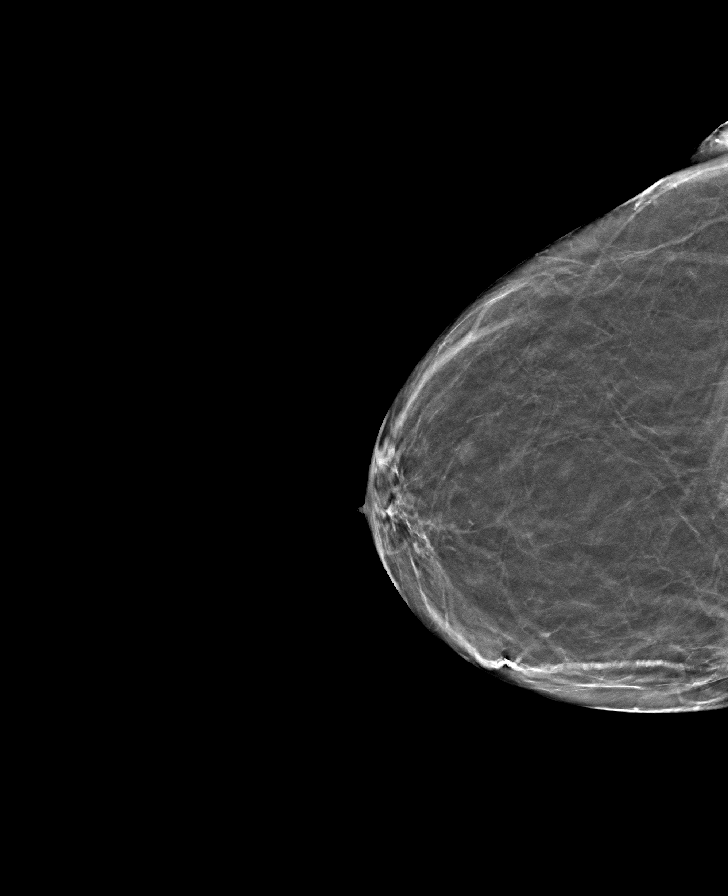

[R MLO tomo · tomo slice 23/45.0]
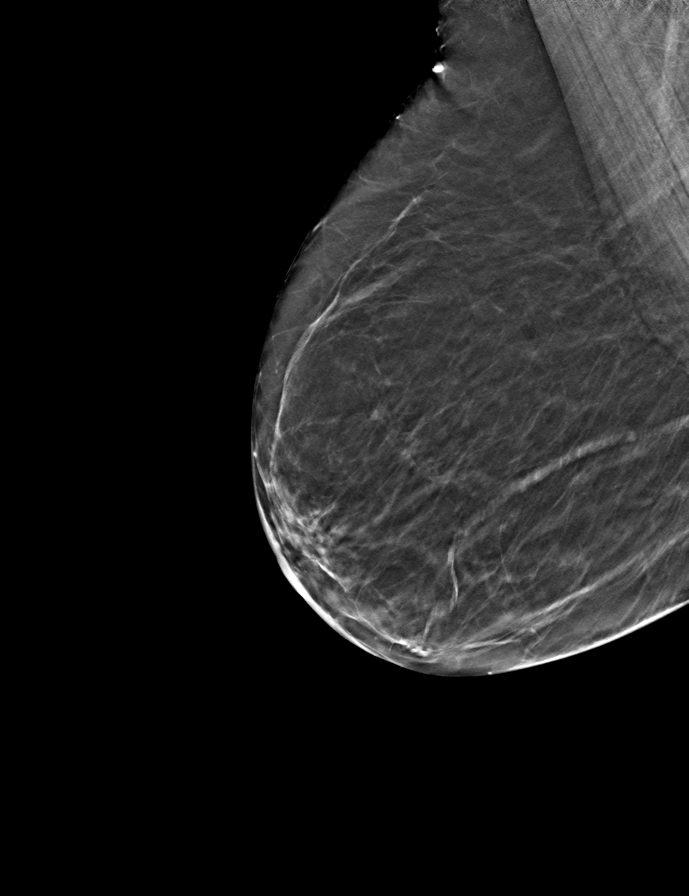

[9 of 24 positions shown; findings below may reference images not displayed]

ACR Breast Density Category b: There are scattered areas of
fibroglandular density.
FINDINGS: There are no findings suspicious for malignancy.
IMPRESSION: No mammographic evidence of malignancy. A result letter of this
screening mammogram will be mailed directly to the patient.

RECOMMENDATION:
Screening mammogram in one year. (Code:51-O-LD2)

BI-RADS CATEGORY  1: Negative.

## 2024-01-14 ENCOUNTER — Ambulatory Visit
Admission: RE | Admit: 2024-01-14 | Discharge: 2024-01-14 | Disposition: A | Discharge status: Home / Self Care | Source: Ambulatory Visit | Attending: Physician Assistant | Admitting: Physician Assistant

## 2024-01-14 DIAGNOSIS — Z1231 Encounter for screening mammogram for malignant neoplasm of breast: Secondary | ICD-10-CM | POA: Insufficient documentation

## 2024-01-21 ENCOUNTER — Other Ambulatory Visit: Payer: Self-pay | Admitting: Family Medicine

## 2024-01-21 DIAGNOSIS — R911 Solitary pulmonary nodule: Secondary | ICD-10-CM

## 2024-01-31 ENCOUNTER — Ambulatory Visit
Admission: RE | Admit: 2024-01-31 | Discharge: 2024-01-31 | Disposition: A | Discharge status: Home / Self Care | Source: Ambulatory Visit | Attending: Family Medicine | Admitting: Family Medicine

## 2024-01-31 DIAGNOSIS — R911 Solitary pulmonary nodule: Secondary | ICD-10-CM | POA: Diagnosis present

## 2024-01-31 MED ORDER — IOHEXOL 300 MG/ML  SOLN
75.0000 mL | Freq: Once | INTRAMUSCULAR | Status: AC | PRN
  Administered 2024-01-31: 75 mL via INTRAVENOUS
